# Patient Record
Sex: Female | Born: 1985 | Race: Black or African American | Hispanic: No | Marital: Single | Smoking: Never smoker
Health system: Southern US, Community
[De-identification: ages and names within clinical notes are randomized; demographics above are authoritative.]

## PROBLEM LIST (undated history)

## (undated) DIAGNOSIS — E669 Obesity, unspecified: Secondary | ICD-10-CM

---

## 2012-12-26 ENCOUNTER — Emergency Department: Payer: Self-pay | Admitting: Emergency Medicine

## 2012-12-26 LAB — COMPREHENSIVE METABOLIC PANEL
Albumin: 3.3 g/dL — ABNORMAL LOW (ref 3.4–5.0)
Anion Gap: 5 — ABNORMAL LOW (ref 7–16)
BUN: 9 mg/dL (ref 7–18)
Chloride: 105 mmol/L (ref 98–107)
Co2: 29 mmol/L (ref 21–32)
Creatinine: 0.65 mg/dL (ref 0.60–1.30)
EGFR (African American): >60
EGFR (Non-African Amer.): >60
Glucose: 80 mg/dL (ref 65–99)
Osmolality: 275 (ref 275–301)
Potassium: 4.2 mmol/L (ref 3.5–5.1)
SGPT (ALT): 16 U/L (ref 12–78)
Total Protein: 8 g/dL (ref 6.4–8.2)

## 2012-12-26 LAB — GC/CHLAMYDIA PROBE AMP

## 2012-12-26 LAB — CBC
HCT: 38.9 % (ref 35.0–47.0)
MCH: 27.8 pg (ref 26.0–34.0)
MCHC: 33.1 g/dL (ref 32.0–36.0)
MCV: 84 fL (ref 80–100)
Platelet: 271 10*3/uL (ref 150–440)
RBC: 4.63 10*6/uL (ref 3.80–5.20)
RDW: 15.4 % — ABNORMAL HIGH (ref 11.5–14.5)
WBC: 6.8 10*3/uL (ref 3.6–11.0)

## 2012-12-26 LAB — URINALYSIS, COMPLETE
Glucose,UR: NEGATIVE mg/dL (ref 0–75)
Ketone: NEGATIVE
Nitrite: NEGATIVE
RBC,UR: 1 /HPF (ref 0–5)
WBC UR: 21 /HPF (ref 0–5)

## 2012-12-26 LAB — WET PREP, GENITAL

## 2013-02-27 ENCOUNTER — Emergency Department: Payer: Self-pay | Admitting: Emergency Medicine

## 2013-02-27 LAB — URINALYSIS, COMPLETE
Bilirubin,UR: NEGATIVE
Nitrite: NEGATIVE
Ph: 7 (ref 4.5–8.0)
Protein: 30
Specific Gravity: 1.018 (ref 1.003–1.030)
Squamous Epithelial: 3
WBC UR: 395 /HPF (ref 0–5)

## 2013-02-27 LAB — COMPREHENSIVE METABOLIC PANEL
Alkaline Phosphatase: 76 U/L (ref 50–136)
Bilirubin,Total: 0.2 mg/dL (ref 0.2–1.0)
Co2: 28 mmol/L (ref 21–32)
Creatinine: 0.71 mg/dL (ref 0.60–1.30)
EGFR (African American): >60
Glucose: 92 mg/dL (ref 65–99)
Osmolality: 276 (ref 275–301)
Potassium: 3.7 mmol/L (ref 3.5–5.1)
SGOT(AST): 43 U/L — ABNORMAL HIGH (ref 15–37)
SGPT (ALT): 32 U/L (ref 12–78)
Sodium: 139 mmol/L (ref 136–145)
Total Protein: 7.1 g/dL (ref 6.4–8.2)

## 2013-02-27 LAB — CBC
HCT: 35.6 % (ref 35.0–47.0)
HGB: 11.7 g/dL — ABNORMAL LOW (ref 12.0–16.0)
MCH: 27.6 pg (ref 26.0–34.0)
MCV: 84 fL (ref 80–100)
Platelet: 230 10*3/uL (ref 150–440)
RDW: 16.1 % — ABNORMAL HIGH (ref 11.5–14.5)
WBC: 9.1 10*3/uL (ref 3.6–11.0)

## 2013-02-28 LAB — GC/CHLAMYDIA PROBE AMP

## 2013-03-01 LAB — URINE CULTURE

## 2013-03-29 ENCOUNTER — Ambulatory Visit: Payer: Self-pay | Admitting: Internal Medicine

## 2013-04-19 ENCOUNTER — Ambulatory Visit: Payer: Self-pay | Admitting: Medical

## 2013-04-19 LAB — URINALYSIS, COMPLETE
Bilirubin,UR: NEGATIVE
Glucose,UR: NEGATIVE mg/dL (ref 0–75)
Leukocyte Esterase: NEGATIVE
Nitrite: NEGATIVE
Ph: 6 (ref 4.5–8.0)
Protein: NEGATIVE
RBC,UR: NONE SEEN /HPF (ref 0–5)
Specific Gravity: >=1.03 (ref 1.003–1.030)

## 2013-04-19 LAB — PREGNANCY, URINE: Pregnancy Test, Urine: NEGATIVE m[IU]/mL

## 2013-04-21 LAB — URINE CULTURE

## 2013-05-23 ENCOUNTER — Emergency Department: Payer: Self-pay | Admitting: Emergency Medicine

## 2013-05-23 LAB — CBC WITH DIFFERENTIAL/PLATELET
BASOS ABS: 0.1 10*3/uL (ref 0.0–0.1)
Basophil %: 0.6 %
Eosinophil #: 0.5 10*3/uL (ref 0.0–0.7)
Eosinophil %: 6.3 %
HCT: 38.6 % (ref 35.0–47.0)
HGB: 12.9 g/dL (ref 12.0–16.0)
LYMPHS ABS: 3.3 10*3/uL (ref 1.0–3.6)
Lymphocyte %: 42 %
MCH: 28.8 pg (ref 26.0–34.0)
MCHC: 33.4 g/dL (ref 32.0–36.0)
MCV: 86 fL (ref 80–100)
Monocyte #: 0.5 x10 3/mm (ref 0.2–0.9)
Monocyte %: 6.7 %
Neutrophil #: 3.5 10*3/uL (ref 1.4–6.5)
Neutrophil %: 44.4 %
Platelet: 261 10*3/uL (ref 150–440)
RBC: 4.48 10*6/uL (ref 3.80–5.20)
RDW: 16.1 % — AB (ref 11.5–14.5)
WBC: 7.9 10*3/uL (ref 3.6–11.0)

## 2013-05-23 LAB — URINALYSIS, COMPLETE
Bacteria: NONE SEEN
Bilirubin,UR: NEGATIVE
Blood: NEGATIVE
GLUCOSE, UR: NEGATIVE mg/dL (ref 0–75)
KETONE: NEGATIVE
LEUKOCYTE ESTERASE: NEGATIVE
Nitrite: NEGATIVE
PROTEIN: NEGATIVE
Ph: 5 (ref 4.5–8.0)
RBC,UR: 1 /HPF (ref 0–5)
SPECIFIC GRAVITY: 1.03 (ref 1.003–1.030)
Squamous Epithelial: 2
WBC UR: 1 /HPF (ref 0–5)

## 2013-05-23 LAB — COMPREHENSIVE METABOLIC PANEL
ALK PHOS: 91 U/L
ANION GAP: 1 — AB (ref 7–16)
AST: 20 U/L (ref 15–37)
Albumin: 3.3 g/dL — ABNORMAL LOW (ref 3.4–5.0)
BUN: 11 mg/dL (ref 7–18)
Bilirubin,Total: 0.3 mg/dL (ref 0.2–1.0)
CO2: 30 mmol/L (ref 21–32)
CREATININE: 0.73 mg/dL (ref 0.60–1.30)
Calcium, Total: 8.7 mg/dL (ref 8.5–10.1)
Chloride: 105 mmol/L (ref 98–107)
EGFR (African American): >60
EGFR (Non-African Amer.): >60
GLUCOSE: 103 mg/dL — AB (ref 65–99)
Osmolality: 272 (ref 275–301)
Potassium: 3.5 mmol/L (ref 3.5–5.1)
SGPT (ALT): 18 U/L (ref 12–78)
Sodium: 136 mmol/L (ref 136–145)
TOTAL PROTEIN: 7.9 g/dL (ref 6.4–8.2)

## 2013-05-23 LAB — GC/CHLAMYDIA PROBE AMP

## 2013-05-23 LAB — WET PREP, GENITAL

## 2013-12-19 ENCOUNTER — Emergency Department: Payer: Self-pay | Admitting: Emergency Medicine

## 2013-12-23 ENCOUNTER — Emergency Department: Payer: Self-pay | Admitting: Emergency Medicine

## 2014-03-05 ENCOUNTER — Emergency Department: Payer: Self-pay | Admitting: Emergency Medicine

## 2014-03-05 LAB — BASIC METABOLIC PANEL
Anion Gap: 5 — ABNORMAL LOW (ref 7–16)
BUN: 11 mg/dL (ref 7–18)
CALCIUM: 8.6 mg/dL (ref 8.5–10.1)
CHLORIDE: 106 mmol/L (ref 98–107)
Co2: 30 mmol/L (ref 21–32)
Creatinine: 0.69 mg/dL (ref 0.60–1.30)
EGFR (Non-African Amer.): >60
Glucose: 85 mg/dL (ref 65–99)
Osmolality: 280 (ref 275–301)
Potassium: 4.1 mmol/L (ref 3.5–5.1)
Sodium: 141 mmol/L (ref 136–145)

## 2014-03-05 LAB — URINALYSIS, COMPLETE
Bacteria: NONE SEEN
Bilirubin,UR: NEGATIVE
Blood: NEGATIVE
GLUCOSE, UR: NEGATIVE mg/dL (ref 0–75)
Ketone: NEGATIVE
Leukocyte Esterase: NEGATIVE
Nitrite: NEGATIVE
Ph: 7 (ref 4.5–8.0)
Protein: NEGATIVE
Specific Gravity: 1.009 (ref 1.003–1.030)
WBC UR: 1 /HPF (ref 0–5)

## 2014-03-05 LAB — CBC
HCT: 37.5 % (ref 35.0–47.0)
HGB: 11.6 g/dL — AB (ref 12.0–16.0)
MCH: 27.6 pg (ref 26.0–34.0)
MCHC: 30.9 g/dL — AB (ref 32.0–36.0)
MCV: 89 fL (ref 80–100)
PLATELETS: 278 10*3/uL (ref 150–440)
RBC: 4.2 10*6/uL (ref 3.80–5.20)
RDW: 15.6 % — AB (ref 11.5–14.5)
WBC: 5.5 10*3/uL (ref 3.6–11.0)

## 2014-03-05 LAB — PREGNANCY, URINE: Pregnancy Test, Urine: NEGATIVE m[IU]/mL

## 2014-06-11 ENCOUNTER — Emergency Department: Payer: Self-pay | Admitting: Emergency Medicine

## 2014-06-11 LAB — COMPREHENSIVE METABOLIC PANEL
Albumin: 3 g/dL — ABNORMAL LOW (ref 3.4–5.0)
Alkaline Phosphatase: 79 U/L (ref 46–116)
Anion Gap: 8 (ref 7–16)
BUN: 14 mg/dL (ref 7–18)
Bilirubin,Total: 0.2 mg/dL (ref 0.2–1.0)
CHLORIDE: 108 mmol/L — AB (ref 98–107)
CO2: 25 mmol/L (ref 21–32)
Calcium, Total: 8.9 mg/dL (ref 8.5–10.1)
Creatinine: 0.79 mg/dL (ref 0.60–1.30)
EGFR (African American): >60
Glucose: 113 mg/dL — ABNORMAL HIGH (ref 65–99)
Osmolality: 283 (ref 275–301)
POTASSIUM: 4.1 mmol/L (ref 3.5–5.1)
SGOT(AST): 22 U/L (ref 15–37)
SGPT (ALT): 16 U/L (ref 14–63)
SODIUM: 141 mmol/L (ref 136–145)
Total Protein: 7.5 g/dL (ref 6.4–8.2)

## 2014-06-11 LAB — CBC WITH DIFFERENTIAL/PLATELET
BASOS PCT: 0.8 %
Basophil #: 0 10*3/uL (ref 0.0–0.1)
EOS ABS: 0.2 10*3/uL (ref 0.0–0.7)
Eosinophil %: 3.5 %
HCT: 35.8 % (ref 35.0–47.0)
HGB: 11.4 g/dL — AB (ref 12.0–16.0)
LYMPHS ABS: 2.7 10*3/uL (ref 1.0–3.6)
Lymphocyte %: 42.7 %
MCH: 28 pg (ref 26.0–34.0)
MCHC: 32 g/dL (ref 32.0–36.0)
MCV: 87 fL (ref 80–100)
Monocyte #: 0.5 x10 3/mm (ref 0.2–0.9)
Monocyte %: 7.2 %
NEUTROS ABS: 2.9 10*3/uL (ref 1.4–6.5)
Neutrophil %: 45.8 %
Platelet: 272 10*3/uL (ref 150–440)
RBC: 4.09 10*6/uL (ref 3.80–5.20)
RDW: 14.9 % — ABNORMAL HIGH (ref 11.5–14.5)
WBC: 6.3 10*3/uL (ref 3.6–11.0)

## 2014-06-11 LAB — URINALYSIS, COMPLETE
Bacteria: NONE SEEN
Bilirubin,UR: NEGATIVE
Blood: NEGATIVE
GLUCOSE, UR: NEGATIVE mg/dL (ref 0–75)
Ketone: NEGATIVE
Leukocyte Esterase: NEGATIVE
NITRITE: NEGATIVE
PH: 5 (ref 4.5–8.0)
Protein: NEGATIVE
RBC,UR: <1 /HPF (ref 0–5)
Specific Gravity: 1.02 (ref 1.003–1.030)
WBC UR: 1 /HPF (ref 0–5)

## 2014-10-27 ENCOUNTER — Encounter: Payer: Self-pay | Admitting: Emergency Medicine

## 2014-10-27 DIAGNOSIS — H66002 Acute suppurative otitis media without spontaneous rupture of ear drum, left ear: Secondary | ICD-10-CM | POA: Insufficient documentation

## 2014-10-27 DIAGNOSIS — J069 Acute upper respiratory infection, unspecified: Secondary | ICD-10-CM | POA: Insufficient documentation

## 2014-10-27 DIAGNOSIS — M940 Chondrocostal junction syndrome [Tietze]: Secondary | ICD-10-CM | POA: Insufficient documentation

## 2014-10-27 DIAGNOSIS — N3000 Acute cystitis without hematuria: Secondary | ICD-10-CM | POA: Insufficient documentation

## 2014-10-27 DIAGNOSIS — Z3202 Encounter for pregnancy test, result negative: Secondary | ICD-10-CM | POA: Diagnosis not present

## 2014-10-27 DIAGNOSIS — Z792 Long term (current) use of antibiotics: Secondary | ICD-10-CM | POA: Insufficient documentation

## 2014-10-27 DIAGNOSIS — Z880 Allergy status to penicillin: Secondary | ICD-10-CM | POA: Insufficient documentation

## 2014-10-27 DIAGNOSIS — M791 Myalgia: Secondary | ICD-10-CM | POA: Diagnosis present

## 2014-10-27 NOTE — ED Notes (Signed)
Pt arrived to the ED for complains of generalized body aches,ear pain and cough x1 day and thinks that she has a UTI. Pt states that her "daughter is sick with similar symptoms and was diagnosed with an ear infection." Pt is AOx4 in no apparent distress.

## 2014-10-28 ENCOUNTER — Emergency Department
Admission: EM | Admit: 2014-10-28 | Discharge: 2014-10-28 | Disposition: A | Discharge status: Home / Self Care | Payer: Medicaid Other | Attending: Emergency Medicine | Admitting: Emergency Medicine

## 2014-10-28 ENCOUNTER — Emergency Department: Payer: Medicaid Other

## 2014-10-28 DIAGNOSIS — M940 Chondrocostal junction syndrome [Tietze]: Secondary | ICD-10-CM

## 2014-10-28 DIAGNOSIS — J069 Acute upper respiratory infection, unspecified: Secondary | ICD-10-CM

## 2014-10-28 DIAGNOSIS — H66002 Acute suppurative otitis media without spontaneous rupture of ear drum, left ear: Secondary | ICD-10-CM

## 2014-10-28 DIAGNOSIS — N3000 Acute cystitis without hematuria: Secondary | ICD-10-CM

## 2014-10-28 LAB — URINALYSIS COMPLETE WITH MICROSCOPIC (ARMC ONLY)
Bilirubin Urine: NEGATIVE
GLUCOSE, UA: NEGATIVE mg/dL
Hgb urine dipstick: NEGATIVE
NITRITE: POSITIVE — AB
Protein, ur: NEGATIVE mg/dL
Specific Gravity, Urine: 1.025 (ref 1.005–1.030)
pH: 5 (ref 5.0–8.0)

## 2014-10-28 LAB — POCT PREGNANCY, URINE: Preg Test, Ur: NEGATIVE

## 2014-10-28 MED ORDER — ALBUTEROL SULFATE HFA 108 (90 BASE) MCG/ACT IN AERS: 2.0000 | INHALATION_SPRAY | Freq: Four times a day (QID) | RESPIRATORY_TRACT | Status: DC | PRN

## 2014-10-28 MED ORDER — ALBUTEROL SULFATE (2.5 MG/3ML) 0.083% IN NEBU
2.5000 mg | INHALATION_SOLUTION | Freq: Once | RESPIRATORY_TRACT | Status: AC
  Administered 2014-10-28: 2.5 mg via RESPIRATORY_TRACT

## 2014-10-28 MED ORDER — SULFAMETHOXAZOLE-TRIMETHOPRIM 800-160 MG PO TABS
ORAL_TABLET | ORAL | Status: AC
  Administered 2014-10-28: 1 via ORAL
  Filled 2014-10-28: qty 1

## 2014-10-28 MED ORDER — SULFAMETHOXAZOLE-TRIMETHOPRIM 800-160 MG PO TABS
1.0000 | ORAL_TABLET | Freq: Once | ORAL | Status: AC
  Administered 2014-10-28: 1 via ORAL

## 2014-10-28 MED ORDER — BENZONATATE 100 MG PO CAPS: 100.0000 mg | ORAL_CAPSULE | Freq: Four times a day (QID) | ORAL | Status: DC | PRN

## 2014-10-28 MED ORDER — KETOROLAC TROMETHAMINE 30 MG/ML IJ SOLN
INTRAMUSCULAR | Status: AC
  Filled 2014-10-28: qty 1

## 2014-10-28 MED ORDER — SULFAMETHOXAZOLE-TRIMETHOPRIM 800-160 MG PO TABS: 1.0000 | ORAL_TABLET | Freq: Two times a day (BID) | ORAL | Status: AC

## 2014-10-28 MED ORDER — AZITHROMYCIN 250 MG PO TABS: ORAL_TABLET | ORAL | Status: AC

## 2014-10-28 MED ORDER — ALBUTEROL SULFATE (2.5 MG/3ML) 0.083% IN NEBU
INHALATION_SOLUTION | RESPIRATORY_TRACT | Status: AC
  Administered 2014-10-28: 2.5 mg via RESPIRATORY_TRACT
  Filled 2014-10-28: qty 3

## 2014-10-28 MED ORDER — KETOROLAC TROMETHAMINE 60 MG/2ML IM SOLN
60.0000 mg | Freq: Once | INTRAMUSCULAR | Status: AC
  Administered 2014-10-28: 60 mg via INTRAMUSCULAR

## 2014-10-28 MED ORDER — KETOROLAC TROMETHAMINE 60 MG/2ML IM SOLN
INTRAMUSCULAR | Status: AC
  Administered 2014-10-28: 60 mg via INTRAMUSCULAR
  Filled 2014-10-28: qty 2

## 2014-10-28 NOTE — Discharge Instructions (Signed)
Costochondritis Costochondritis, sometimes called Tietze syndrome, is a swelling and irritation (inflammation) of the tissue (cartilage) that connects your ribs with your breastbone (sternum). It causes pain in the chest and rib area. Costochondritis usually goes away on its own over time. It can take up to 6 weeks or longer to get better, especially if you are unable to limit your activities. CAUSES  Some cases of costochondritis have no known cause. Possible causes include:  Injury (trauma).  Exercise or activity such as lifting.  Severe coughing. SIGNS AND SYMPTOMS  Pain and tenderness in the chest and rib area.  Pain that gets worse when coughing or taking deep breaths.  Pain that gets worse with specific movements. DIAGNOSIS  Your health care provider will do a physical exam and ask about your symptoms. Chest X-rays or other tests may be done to rule out other problems. TREATMENT  Costochondritis usually goes away on its own over time. Your health care provider may prescribe medicine to help relieve pain. HOME CARE INSTRUCTIONS   Avoid exhausting physical activity. Try not to strain your ribs during normal activity. This would include any activities using chest, abdominal, and side muscles, especially if heavy weights are used.  Apply ice to the affected area for the first 2 days after the pain begins.  Put ice in a plastic bag.  Place a towel between your skin and the bag.  Leave the ice on for 20 minutes, 2-3 times a day.  Only take over-the-counter or prescription medicines as directed by your health care provider. SEEK MEDICAL CARE IF:  You have redness or swelling at the rib joints. These are signs of infection.  Your pain does not go away despite rest or medicine. SEEK IMMEDIATE MEDICAL CARE IF:   Your pain increases or you are very uncomfortable.  You have shortness of breath or difficulty breathing.  You cough up blood.  You have worse chest pains,  sweating, or vomiting.  You have a fever or persistent symptoms for more than 2-3 days.  You have a fever and your symptoms suddenly get worse. MAKE SURE YOU:   Understand these instructions.  Will watch your condition.  Will get help right away if you are not doing well or get worse. Document Released: 02/09/2005 Document Revised: 02/20/2013 Document Reviewed: 12/04/2012 [facility] Patient Information 2015 [place], [place]. This information is not intended to replace advice given to you by your health care provider. Make sure you discuss any questions you have with your health care provider.  Otitis Media Otitis media is redness, soreness, and inflammation of the middle ear. Otitis media may be caused by allergies or, most commonly, by infection. Often it occurs as a complication of the common cold. SIGNS AND SYMPTOMS Symptoms of otitis media may include:  Earache.  Fever.  Ringing in your ear.  Headache.  Leakage of fluid from the ear. DIAGNOSIS To diagnose otitis media, your health care provider will examine your ear with an otoscope. This is an instrument that allows your health care provider to see into your ear in order to examine your eardrum. Your health care provider also will ask you questions about your symptoms. TREATMENT  Typically, otitis media resolves on its own within 3-5 days. Your health care provider may prescribe medicine to ease your symptoms of pain. If otitis media does not resolve within 5 days or is recurrent, your health care provider may prescribe antibiotic medicines if he or she suspects that a bacterial infection is the cause.  HOME CARE INSTRUCTIONS   If you were prescribed an antibiotic medicine, finish it all even if you start to feel better.  Take medicines only as directed by your health care provider.  Keep all follow-up visits as directed by your health care provider. SEEK MEDICAL CARE IF:  You have otitis media only in one ear, or bleeding  from your nose, or both.  You notice a lump on your neck.  You are not getting better in 3-5 days.  You feel worse instead of better. SEEK IMMEDIATE MEDICAL CARE IF:   You have pain that is not controlled with medicine.  You have swelling, redness, or pain around your ear or stiffness in your neck.  You notice that part of your face is paralyzed.  You notice that the bone behind your ear (mastoid) is tender when you touch it. MAKE SURE YOU:   Understand these instructions.  Will watch your condition.  Will get help right away if you are not doing well or get worse. Document Released: 02/05/2004 Document Revised: 09/16/2013 Document Reviewed: 11/27/2012 [facility] Patient Information 2015 [place], [place]. This information is not intended to replace advice given to you by your health care provider. Make sure you discuss any questions you have with your health care provider.  Upper Respiratory Infection, Adult An upper respiratory infection (URI) is also sometimes known as the common cold. The upper respiratory tract includes the nose, sinuses, throat, trachea, and bronchi. Bronchi are the airways leading to the lungs. Most people improve within 1 week, but symptoms can last up to 2 weeks. A residual cough may last even longer.  CAUSES Many different viruses can infect the tissues lining the upper respiratory tract. The tissues become irritated and inflamed and often become very moist. Mucus production is also common. A cold is contagious. You can easily spread the virus to others by oral contact. This includes kissing, sharing a glass, coughing, or sneezing. Touching your mouth or nose and then touching a surface, which is then touched by another person, can also spread the virus. SYMPTOMS  Symptoms typically develop 1 to 3 days after you come in contact with a cold virus. Symptoms vary from person to person. They may include:  Runny nose.  Sneezing.  Nasal congestion.  Sinus  irritation.  Sore throat.  Loss of voice (laryngitis).  Cough.  Fatigue.  Muscle aches.  Loss of appetite.  Headache.  Low-grade fever. DIAGNOSIS  You might diagnose your own cold based on familiar symptoms, since most people get a cold 2 to 3 times a year. Your caregiver can confirm this based on your exam. Most importantly, your caregiver can check that your symptoms are not due to another disease such as strep throat, sinusitis, pneumonia, asthma, or epiglottitis. Blood tests, throat tests, and X-rays are not necessary to diagnose a common cold, but they may sometimes be helpful in excluding other more serious diseases. Your caregiver will decide if any further tests are required. RISKS AND COMPLICATIONS  You may be at risk for a more severe case of the common cold if you smoke cigarettes, have chronic heart disease (such as heart failure) or lung disease (such as asthma), or if you have a weakened immune system. The very young and very old are also at risk for more serious infections. Bacterial sinusitis, middle ear infections, and bacterial pneumonia can complicate the common cold. The common cold can worsen asthma and chronic obstructive pulmonary disease (COPD). Sometimes, these complications can require emergency medical  care and may be life-threatening. PREVENTION  The best way to protect against getting a cold is to practice good hygiene. Avoid oral or hand contact with people with cold symptoms. Wash your hands often if contact occurs. There is no clear evidence that vitamin C, vitamin E, echinacea, or exercise reduces the chance of developing a cold. However, it is always recommended to get plenty of rest and practice good nutrition. TREATMENT  Treatment is directed at relieving symptoms. There is no cure. Antibiotics are not effective, because the infection is caused by a virus, not by bacteria. Treatment may include:  Increased fluid intake. Sports drinks offer valuable  electrolytes, sugars, and fluids.  Breathing heated mist or steam (vaporizer or shower).  Eating chicken soup or other clear broths, and maintaining good nutrition.  Getting plenty of rest.  Using gargles or lozenges for comfort.  Controlling fevers with ibuprofen or acetaminophen as directed by your caregiver.  Increasing usage of your inhaler if you have asthma. Zinc gel and zinc lozenges, taken in the first 24 hours of the common cold, can shorten the duration and lessen the severity of symptoms. Pain medicines may help with fever, muscle aches, and throat pain. A variety of non-prescription medicines are available to treat congestion and runny nose. Your caregiver can make recommendations and may suggest nasal or lung inhalers for other symptoms.  HOME CARE INSTRUCTIONS   Only take over-the-counter or prescription medicines for pain, discomfort, or fever as directed by your caregiver.  Use a warm mist humidifier or inhale steam from a shower to increase air moisture. This may keep secretions moist and make it easier to breathe.  Drink enough water and fluids to keep your urine clear or pale yellow.  Rest as needed.  Return to work when your temperature has returned to normal or as your caregiver advises. You may need to stay home longer to avoid infecting others. You can also use a face mask and careful hand washing to prevent spread of the virus. SEEK MEDICAL CARE IF:   After the first few days, you feel you are getting worse rather than better.  You need your caregiver's advice about medicines to control symptoms.  You develop chills, worsening shortness of breath, or brown or red sputum. These may be signs of pneumonia.  You develop yellow or brown nasal discharge or pain in the face, especially when you bend forward. These may be signs of sinusitis.  You develop a fever, swollen neck glands, pain with swallowing, or white areas in the back of your throat. These may be signs  of strep throat. SEEK IMMEDIATE MEDICAL CARE IF:   You have a fever.  You develop severe or persistent headache, ear pain, sinus pain, or chest pain.  You develop wheezing, a prolonged cough, cough up blood, or have a change in your usual mucus (if you have chronic lung disease).  You develop sore muscles or a stiff neck. Document Released: 10/26/2000 Document Revised: 07/25/2011 Document Reviewed: 08/07/2013 [facility] Patient Information 2015 [place], [place]. This information is not intended to replace advice given to you by your health care provider. Make sure you discuss any questions you have with your health care provider.

## 2014-10-28 NOTE — ED Notes (Signed)
Sherilyn Cooter, RN Called lab to add urine culture to collected urine sample.

## 2014-10-28 NOTE — ED Provider Notes (Signed)
[facility] Emergency Department Provider Note  ____________________________________________  Time seen: Approximately 12:23 AM  I have reviewed the triage vital signs and the nursing notes.   HISTORY  Chief Complaint Generalized Body Aches    HPI REANN CURTAIN is a 28 y.o. female comes in with multiple complaints. The patient reports that her ears hurt, she has a headache, she has chest and back pain as well as pain with urination. The patient reports that her symptoms all started yesterday afternoon. She started with a runny nose and a headache. She reports that she has been taking Tylenol severe cold symptoms but has not been helping. The patient has not checked her temperature so she is unsure she's had a fever. She has had a productive cough of yellow phlegm. The patient reports that her daughter has been sick with an ear infection so she is unsure if she may have gotten symptoms from her daughter. The patient has a primary care physician but reports that she has not gone to see him. She has had some mild shortness of breath and chest tightness. She's had a decreased appetite. She reports that her entire back also hurts. She denies any nausea or vomiting but has had some mild sore throat. The patient is here for evaluation and treatment.   History reviewed. No pertinent past medical history.  There are no active problems to display for this patient.   C-section  Current Outpatient Rx  Name  Route  Sig  Dispense  Refill  . albuterol (PROVENTIL HFA;VENTOLIN HFA) 108 (90 BASE) MCG/ACT inhaler   Inhalation   Inhale 2 puffs into the lungs every 6 (six) hours as needed for wheezing or shortness of breath.   1 Inhaler   0   . azithromycin (ZITHROMAX Z-PAK) 250 MG tablet      Take 2 tablets (500 mg) on  Day 1,  followed by 1 tablet (250 mg) once daily on Days 2 through 5.   6 each   0   . benzonatate (TESSALON PERLES) 100 MG capsule   Oral   Take 1  capsule (100 mg total) by mouth every 6 (six) hours as needed for cough.   15 capsule   0   . sulfamethoxazole-trimethoprim (BACTRIM DS,SEPTRA DS) 800-160 MG per tablet   Oral   Take 1 tablet by mouth 2 (two) times daily.   10 tablet   0     Allergies Amoxicillin; Morphine and related; and Penicillins  History reviewed. No pertinent family history.  Social History History  Substance Use Topics  . Smoking status: Never Smoker   . Smokeless tobacco: Not on file  . Alcohol Use: Yes    Review of Systems Constitutional: No fever/chills Eyes: No visual changes. ENT: sore throat. Cardiovascular: chest pain. Respiratory: shortness of breath. Gastrointestinal: No abdominal pain.  No nausea, no vomiting.   Genitourinary:  dysuria. Musculoskeletal: back pain. Skin: Negative for rash. Neurological: Headache  10-point ROS otherwise negative.  ____________________________________________   PHYSICAL EXAM:  VITAL SIGNS: ED Triage Vitals  Enc Vitals Group     BP 10/27/14 2226 119/72 mmHg     Pulse Rate 10/27/14 2226 99     Resp 10/27/14 2226 18     Temp 10/27/14 2226 98.8 F (37.1 C)     Temp Source 10/27/14 2226 Oral     SpO2 10/27/14 2226 97 %     Weight 10/27/14 2226 240 lb (108.863 kg)     Height 10/27/14  2226 5' (1.524 m)     Head Cir --      Peak Flow --      Pain Score 10/27/14 2227 10     Pain Loc --      Pain Edu? --      Excl. in GC? --     Constitutional: Alert and oriented. Well appearing and in moderate distress. Eyes: Conjunctivae are normal. PERRL. EOMI. Head: Atraumatic, left TM with some erythema and mild bulging Nose: No congestion/rhinnorhea. Mouth/Throat: Mucous membranes are moist.  Oropharynx non-erythematous. Hematological/Lymphatic/Immunilogical: No cervical lymphadenopathy. Cardiovascular: Normal rate, regular rhythm. Grossly normal heart sounds.  Good peripheral circulation. Respiratory: Normal respiratory effort.  No retractions. Lungs  CTAB, chest tender to palpation Gastrointestinal: Soft and nontender. No distention. Positive bowel sounds Genitourinary: Deferred Musculoskeletal: No lower extremity tenderness nor edema.   Neurologic:  Normal speech and language. No gross focal neurologic deficits are appreciated.  Skin:  Skin is warm, dry and intact. No rash noted. Psychiatric: Mood and affect are normal.   ____________________________________________   LABS (all labs ordered are listed, but only abnormal results are displayed)  Labs Reviewed  URINALYSIS COMPLETEWITH MICROSCOPIC ([facility] ONLY) - Abnormal; Notable for the following:    Color, Urine YELLOW (*)    APPearance HAZY (*)    Ketones, ur TRACE (*)    Nitrite POSITIVE (*)    Leukocytes, UA 1+ (*)    Bacteria, UA MANY (*)    Squamous Epithelial / LPF 6-30 (*)    All other components within normal limits  URINE CULTURE  POCT PREGNANCY, URINE   ____________________________________________  EKG  None ____________________________________________  RADIOLOGY  Chest x-ray: No acute cardiopulmonary disease ____________________________________________   PROCEDURES  Procedure(s) performed: None  Critical Care performed: No  ____________________________________________   INITIAL IMPRESSION / ASSESSMENT AND PLAN / ED COURSE  Pertinent labs & imaging results that were available during my care of the patient were reviewed by me and considered in my medical decision making (see chart for details).  This is a 29 year old female who comes in with multiple complaints including ear pain and headache chest him back pain and pain with urination. Given the patient's symptoms it is likely that she has a urinary tract infection. She does appear to have an otitis media on the left side. I will do a chest x-ray to evaluate the patient for possible pneumonia and check the patient's urine for infection. I will give the patient a dose of Toradol and a breathing  treatment to see if that does help with some of the chest tightness.  ----------------------------------------- 3:02 AM on 10/28/2014 -----------------------------------------  The patient reports that she did have some mild improvement in her pain and the tightness in her chest after the breathing treatment. The patient also received a dose of Septra for her urinary tract infection. I will discharge the patient home to follow-up with her primary care physician in one to 2 days. ____________________________________________   FINAL CLINICAL IMPRESSION(S) / ED DIAGNOSES  Final diagnoses:  Upper respiratory infection  Acute suppurative otitis media of left ear without spontaneous rupture of tympanic membrane, recurrence not specified  Acute cystitis without hematuria  Costochondritis, acute      Rebecka Apley, MD 10/28/14 0302

## 2014-10-30 LAB — URINE CULTURE
Culture: >=100000
SPECIAL REQUESTS: NORMAL

## 2015-03-11 ENCOUNTER — Inpatient Hospital Stay (HOSPITAL_COMMUNITY)
Admission: AD | Admit: 2015-03-11 | Discharge: 2015-03-11 | Disposition: A | Discharge status: Home / Self Care | Payer: Self-pay | Source: Ambulatory Visit | Attending: Family Medicine | Admitting: Family Medicine

## 2015-03-11 ENCOUNTER — Encounter (HOSPITAL_COMMUNITY): Payer: Self-pay | Admitting: *Deleted

## 2015-03-11 DIAGNOSIS — R102 Pelvic and perineal pain: Secondary | ICD-10-CM | POA: Insufficient documentation

## 2015-03-11 LAB — WET PREP, GENITAL
Trich, Wet Prep: NONE SEEN
YEAST WET PREP: NONE SEEN

## 2015-03-11 LAB — CBC
HCT: 35.6 % — ABNORMAL LOW (ref 36.0–46.0)
Hemoglobin: 11.5 g/dL — ABNORMAL LOW (ref 12.0–15.0)
MCH: 28.7 pg (ref 26.0–34.0)
MCHC: 32.3 g/dL (ref 30.0–36.0)
MCV: 88.8 fL (ref 78.0–100.0)
Platelets: 254 10*3/uL (ref 150–400)
RBC: 4.01 MIL/uL (ref 3.87–5.11)
RDW: 15.5 % (ref 11.5–15.5)
WBC: 8.1 10*3/uL (ref 4.0–10.5)

## 2015-03-11 LAB — URINALYSIS, ROUTINE W REFLEX MICROSCOPIC
BILIRUBIN URINE: NEGATIVE
Glucose, UA: NEGATIVE mg/dL
Hgb urine dipstick: NEGATIVE
KETONES UR: NEGATIVE mg/dL
Leukocytes, UA: NEGATIVE
Nitrite: NEGATIVE
Protein, ur: NEGATIVE mg/dL
UROBILINOGEN UA: 0.2 mg/dL (ref 0.0–1.0)
pH: 5.5 (ref 5.0–8.0)

## 2015-03-11 LAB — POCT PREGNANCY, URINE: PREG TEST UR: NEGATIVE

## 2015-03-11 MED ORDER — KETOROLAC TROMETHAMINE 60 MG/2ML IM SOLN
60.0000 mg | Freq: Once | INTRAMUSCULAR | Status: AC
  Administered 2015-03-11: 60 mg via INTRAMUSCULAR
  Filled 2015-03-11: qty 2

## 2015-03-11 MED ORDER — IBUPROFEN 800 MG PO TABS: 800.0000 mg | ORAL_TABLET | Freq: Three times a day (TID) | ORAL | Status: DC | PRN

## 2015-03-11 NOTE — MAU Provider Note (Signed)
History     CSN: [id number]  Arrival date and time: 03/11/15 1191   First Provider Initiated Contact with Patient 03/11/15 2101      Chief Complaint  Patient presents with  . Abdominal Pain  . Pelvic Pain   Pelvic Pain The patient's primary symptoms include pelvic pain. This is a new problem. The current episode started yesterday. The problem occurs constantly. The problem has been gradually worsening. Pain severity now: 8/10. The problem affects both sides. She is not pregnant. Associated symptoms include abdominal pain, dysuria and nausea. Pertinent negatives include no constipation, diarrhea, fever, frequency, urgency (x1 today ) or vomiting. The vaginal discharge was normal. There has been no bleeding (LMP 9/?/2016). The symptoms are aggravated by intercourse. She has tried nothing for the symptoms. She is sexually active. No, her partner does not have an STD. She uses nothing for contraception. Her menstrual history has been regular.    History reviewed. No pertinent past medical history.  History reviewed. No pertinent past surgical history.  History reviewed. No pertinent family history.  Social History  Substance Use Topics  . Smoking status: Never Smoker   . Smokeless tobacco: None  . Alcohol Use: Yes    Allergies:  Allergies  Allergen Reactions  . Amoxicillin Hives  . Morphine And Related Nausea And Vomiting  . Penicillins Hives    Has patient had a PCN reaction causing immediate rash, facial/tongue/throat swelling, SOB or lightheadedness with hypotension: Yes Has patient had a PCN reaction causing severe rash involving mucus membranes or skin necrosis: No Has patient had a PCN reaction that required hospitalization No Has patient had a PCN reaction occurring within the last 10 years: Yes If all of the above answers are "NO", then may proceed with Cephalosporin use.   Marland Kitchen Pineapple Itching    Irritates the mouth   . Tomato Itching    Irritates the mouth     Prescriptions prior to admission  Medication Sig Dispense Refill Last Dose  . Prenatal Vit-Fe Fumarate-FA (PRENATAL MULTIVITAMIN) TABS tablet Take 1 tablet by mouth daily at 12 noon.   Past Week at Unknown time  . albuterol (PROVENTIL HFA;VENTOLIN HFA) 108 (90 BASE) MCG/ACT inhaler Inhale 2 puffs into the lungs every 6 (six) hours as needed for wheezing or shortness of breath. 1 Inhaler 0   . benzonatate (TESSALON PERLES) 100 MG capsule Take 1 capsule (100 mg total) by mouth every 6 (six) hours as needed for cough. 15 capsule 0     Review of Systems  Constitutional: Negative for fever.  Gastrointestinal: Positive for nausea and abdominal pain. Negative for vomiting, diarrhea and constipation.  Genitourinary: Positive for dysuria and pelvic pain. Negative for urgency (x1 today ) and frequency.   Physical Exam   Blood pressure 143/83, pulse 81, temperature 98.4 F (36.9 C), temperature source Oral, resp. rate 18, height 5' 0.5" (1.537 m), weight 123.469 kg (272 lb 3.2 oz), SpO2 98 %.  Physical Exam  Nursing note and vitals reviewed. Constitutional: She is oriented to person, place, and time. She appears well-developed and well-nourished. No distress.  HENT:  Head: Normocephalic.  Cardiovascular: Normal rate.   Respiratory: Effort normal.  GI: There is no tenderness.  Genitourinary:   External: no lesion Vagina: small amount of white discharge Cervix: pink, smooth, no CMT Uterus: NSSC Adnexa: NT   Neurological: She is alert and oriented to person, place, and time.  Skin: Skin is warm and dry.  Psychiatric: She has a normal mood and  affect.   Results for orders placed or performed during the hospital encounter of 03/11/15 (from the past 24 hour(s))  Urinalysis, Routine w reflex microscopic (not at [facility])     Status: Abnormal   Collection Time: 03/11/15  7:45 PM  Result Value Ref Range   Color, Urine YELLOW YELLOW   APPearance CLEAR CLEAR   Specific Gravity, Urine >1.030  (H) 1.005 - 1.030   pH 5.5 5.0 - 8.0   Glucose, UA NEGATIVE NEGATIVE mg/dL   Hgb urine dipstick NEGATIVE NEGATIVE   Bilirubin Urine NEGATIVE NEGATIVE   Ketones, ur NEGATIVE NEGATIVE mg/dL   Protein, ur NEGATIVE NEGATIVE mg/dL   Urobilinogen, UA 0.2 0.0 - 1.0 mg/dL   Nitrite NEGATIVE NEGATIVE   Leukocytes, UA NEGATIVE NEGATIVE  Pregnancy, urine POC     Status: None   Collection Time: 03/11/15  7:58 PM  Result Value Ref Range   Preg Test, Ur NEGATIVE NEGATIVE  Wet prep, genital     Status: Abnormal   Collection Time: 03/11/15  9:12 PM  Result Value Ref Range   Yeast Wet Prep HPF POC NONE SEEN NONE SEEN   Trich, Wet Prep NONE SEEN NONE SEEN   Clue Cells Wet Prep HPF POC FEW (A) NONE SEEN   WBC, Wet Prep HPF POC FEW (A) NONE SEEN  CBC     Status: Abnormal   Collection Time: 03/11/15  9:20 PM  Result Value Ref Range   WBC 8.1 4.0 - 10.5 K/uL   RBC 4.01 3.87 - 5.11 MIL/uL   Hemoglobin 11.5 (L) 12.0 - 15.0 g/dL   HCT 96.035.6 (L) 45.436.0 - 09.846.0 %   MCV 88.8 78.0 - 100.0 fL   MCH 28.7 26.0 - 34.0 pg   MCHC 32.3 30.0 - 36.0 g/dL   RDW 11.915.5 14.711.5 - 82.915.5 %   Platelets 254 150 - 400 K/uL     MAU Course  Procedures  MDM   Assessment and Plan   1. Pelvic pain in female    DC home Comfort measures reviewed  RX: ibuprofen 800mg  #30  Return to MAU as needed   Follow-up Information    Follow up with [facility]-EMERGENCY DEPT.   Specialty:  Emergency Medicine   Why:  If symptoms worsen   Contact information:   [address] [id number] [place] [phone]        Tawnya CrookHogan, Khilynn Borntreger Donovan 03/11/2015, 9:02 PM

## 2015-03-11 NOTE — MAU Note (Signed)
Pt states that she is having stomach and pelvic pain that started yesterday. Denies vag bleeding or discharge. LMP: was in August or September-last period was shorter than normal. Took UPT at home about a week ago and was negative. Having some nausea but no vomiting. Having increase in frequency with urination with some pain today. Has not taken anything for pain.

## 2015-03-11 NOTE — Discharge Instructions (Signed)

## 2015-03-12 LAB — GC/CHLAMYDIA PROBE AMP (~~LOC~~) NOT AT ARMC
Chlamydia: NEGATIVE
Neisseria Gonorrhea: NEGATIVE

## 2015-03-12 LAB — HIV ANTIBODY (ROUTINE TESTING W REFLEX): HIV SCREEN 4TH GENERATION: NONREACTIVE

## 2015-03-12 LAB — RPR: RPR: NONREACTIVE

## 2015-05-11 ENCOUNTER — Encounter (HOSPITAL_COMMUNITY): Payer: Self-pay | Admitting: *Deleted

## 2015-05-11 ENCOUNTER — Emergency Department (HOSPITAL_COMMUNITY)
Admission: EM | Admit: 2015-05-11 | Discharge: 2015-05-11 | Disposition: A | Discharge status: Home / Self Care | Payer: Medicaid Other | Attending: Emergency Medicine | Admitting: Emergency Medicine

## 2015-05-11 DIAGNOSIS — Z880 Allergy status to penicillin: Secondary | ICD-10-CM | POA: Insufficient documentation

## 2015-05-11 DIAGNOSIS — R110 Nausea: Secondary | ICD-10-CM

## 2015-05-11 DIAGNOSIS — Z79899 Other long term (current) drug therapy: Secondary | ICD-10-CM | POA: Insufficient documentation

## 2015-05-11 DIAGNOSIS — R42 Dizziness and giddiness: Secondary | ICD-10-CM | POA: Insufficient documentation

## 2015-05-11 DIAGNOSIS — R112 Nausea with vomiting, unspecified: Secondary | ICD-10-CM | POA: Insufficient documentation

## 2015-05-11 DIAGNOSIS — R51 Headache: Secondary | ICD-10-CM | POA: Insufficient documentation

## 2015-05-11 DIAGNOSIS — R102 Pelvic and perineal pain: Secondary | ICD-10-CM | POA: Insufficient documentation

## 2015-05-11 DIAGNOSIS — R109 Unspecified abdominal pain: Secondary | ICD-10-CM | POA: Insufficient documentation

## 2015-05-11 DIAGNOSIS — Z3202 Encounter for pregnancy test, result negative: Secondary | ICD-10-CM | POA: Insufficient documentation

## 2015-05-11 LAB — URINALYSIS, ROUTINE W REFLEX MICROSCOPIC
BILIRUBIN URINE: NEGATIVE
GLUCOSE, UA: NEGATIVE mg/dL
HGB URINE DIPSTICK: NEGATIVE
Ketones, ur: NEGATIVE mg/dL
Leukocytes, UA: NEGATIVE
Nitrite: NEGATIVE
PH: 6.5 (ref 5.0–8.0)
Protein, ur: NEGATIVE mg/dL
Specific Gravity, Urine: 1.023 (ref 1.005–1.030)

## 2015-05-11 LAB — CBC
HEMATOCRIT: 37.4 % (ref 36.0–46.0)
HEMOGLOBIN: 11.5 g/dL — AB (ref 12.0–15.0)
MCH: 27.6 pg (ref 26.0–34.0)
MCHC: 30.7 g/dL (ref 30.0–36.0)
MCV: 89.9 fL (ref 78.0–100.0)
Platelets: 286 10*3/uL (ref 150–400)
RBC: 4.16 MIL/uL (ref 3.87–5.11)
RDW: 15 % (ref 11.5–15.5)
WBC: 7 10*3/uL (ref 4.0–10.5)

## 2015-05-11 LAB — COMPREHENSIVE METABOLIC PANEL
ALBUMIN: 3.2 g/dL — AB (ref 3.5–5.0)
ALT: 14 U/L (ref 14–54)
ANION GAP: 6 (ref 5–15)
AST: 16 U/L (ref 15–41)
Alkaline Phosphatase: 70 U/L (ref 38–126)
BUN: 9 mg/dL (ref 6–20)
CALCIUM: 8.9 mg/dL (ref 8.9–10.3)
CHLORIDE: 104 mmol/L (ref 101–111)
CO2: 28 mmol/L (ref 22–32)
Creatinine, Ser: 0.69 mg/dL (ref 0.44–1.00)
GFR calc Af Amer: >60 mL/min (ref >60)
GFR calc non Af Amer: >60 mL/min (ref >60)
GLUCOSE: 124 mg/dL — AB (ref 65–99)
POTASSIUM: 4.2 mmol/L (ref 3.5–5.1)
SODIUM: 138 mmol/L (ref 135–145)
Total Bilirubin: 0.5 mg/dL (ref 0.3–1.2)
Total Protein: 7.1 g/dL (ref 6.5–8.1)

## 2015-05-11 LAB — I-STAT BETA HCG BLOOD, ED (MC, WL, AP ONLY): I-stat hCG, quantitative: <5 m[IU]/mL (ref <5)

## 2015-05-11 LAB — LIPASE, BLOOD: LIPASE: 22 U/L (ref 11–51)

## 2015-05-11 MED ORDER — MECLIZINE HCL 25 MG PO TABS: 25.0000 mg | ORAL_TABLET | Freq: Three times a day (TID) | ORAL | Status: DC | PRN

## 2015-05-11 MED ORDER — MECLIZINE HCL 25 MG PO TABS
25.0000 mg | ORAL_TABLET | Freq: Once | ORAL | Status: AC
  Administered 2015-05-11: 25 mg via ORAL
  Filled 2015-05-11: qty 1

## 2015-05-11 NOTE — ED Notes (Signed)
Pt remains monitored by blood pressure and pulse ox. Pt has visitor at bedside.

## 2015-05-11 NOTE — Discharge Instructions (Signed)
Dizziness Dizziness is a common problem. It makes you feel unsteady or lightheaded. You may feel like you are about to pass out (faint). Dizziness can lead to injury if you stumble or fall. Anyone can get dizzy, but dizziness is more common in older adults. This condition can be caused by a number of things, including:  Medicines.  Dehydration.  Illness. HOME CARE Following these instructions may help with your condition: Eating and Drinking  Drink enough fluid to keep your pee (urine) clear or pale yellow. This helps to keep you from getting dehydrated. Try to drink more clear fluids, such as water.  Do not drink alcohol.  Limit how much caffeine you drink or eat if told by your doctor.  Limit how much salt you drink or eat if told by your doctor. Activity  Avoid making quick movements.  When you stand up from sitting in a chair, steady yourself until you feel okay.  In the morning, first sit up on the side of the bed. When you feel okay, stand slowly while you hold onto something. Do this until you know that your balance is fine.  Move your legs often if you need to stand in one place for a long time. Tighten and relax your muscles in your legs while you are standing.  Do not drive or use heavy machinery if you feel dizzy.  Avoid bending down if you feel dizzy. Place items in your home so that they are easy for you to reach without leaning over. Lifestyle  Do not use any tobacco products, including cigarettes, chewing tobacco, or electronic cigarettes. If you need help quitting, ask your doctor.  Try to lower your stress level, such as with yoga or meditation. Talk with your doctor if you need help. General Instructions  Watch your dizziness for any changes.  Take medicines only as told by your doctor. Talk with your doctor if you think that your dizziness is caused by a medicine that you are taking.  Tell a friend or a family member that you are feeling dizzy. If he or  she notices any changes in your behavior, have this person call your doctor.  Keep all follow-up visits as told by your doctor. This is important. GET HELP IF:  Your dizziness does not go away.  Your dizziness or light-headedness gets worse.  You feel sick to your stomach (nauseous).  You have trouble hearing.  You have new symptoms.  You are unsteady on your feet or you feel like the room is spinning. GET HELP RIGHT AWAY IF:  You throw up (vomit) or have diarrhea and are unable to eat or drink anything.  You have trouble:  Talking.  Walking.  Swallowing.  Using your arms, hands, or legs.  You feel generally weak.  You are not thinking clearly or you have trouble forming sentences. It may take a friend or family member to notice this.  You have:  Chest pain.  Pain in your belly (abdomen).  Shortness of breath.  Sweating.  Your vision changes.  You are bleeding.  You have a headache.  You have neck pain or a stiff neck.  You have a fever.   This information is not intended to replace advice given to you by your health care provider. Make sure you discuss any questions you have with your health care provider.   Document Released: 04/21/2011 Document Revised: 09/16/2014 Document Reviewed: 04/28/2014 Elsevier Interactive Patient Education 2016 [employer].  Teaching laboratory technician Self-Care WHAT  IS THE EPLEY MANEUVER? The Epley maneuver is an exercise you can do to relieve symptoms of benign paroxysmal positional vertigo (BPPV). This condition is often just referred to as vertigo. BPPV is caused by the movement of tiny crystals (canaliths) inside your inner ear. The accumulation and movement of canaliths in your inner ear causes a sudden spinning sensation (vertigo) when you move your head to certain positions. Vertigo usually lasts about 30 seconds. BPPV usually occurs in just one ear. If you get vertigo when you lie on your left side, you probably have BPPV in your  left ear. Your health care provider can tell you which ear is involved.  BPPV may be caused by a head injury. Many people older than 50 get BPPV for unknown reasons. If you have been diagnosed with BPPV, your health care provider may teach you how to do this maneuver. BPPV is not life threatening (benign) and usually goes away in time.  WHEN SHOULD I PERFORM THE EPLEY MANEUVER? You can do this maneuver at home whenever you have symptoms of vertigo. You may do the Epley maneuver up to 3 times a day until your symptoms of vertigo go away. HOW SHOULD I DO THE EPLEY MANEUVER?  Sit on the edge of a bed or table with your back straight. Your legs should be extended or hanging over the edge of the bed or table.   Turn your head halfway toward the affected ear.   Lie backward quickly with your head turned until you are lying flat on your back. You may want to position a pillow under your shoulders.   Hold this position for 30 seconds. You may experience an attack of vertigo. This is normal. Hold this position until the vertigo stops.  Then turn your head to the opposite direction until your unaffected ear is facing the floor.   Hold this position for 30 seconds. You may experience an attack of vertigo. This is normal. Hold this position until the vertigo stops.  Now turn your whole body to the same side as your head. Hold for another 30 seconds.   You can then sit back up. ARE THERE RISKS TO THIS MANEUVER? In some cases, you may have other symptoms (such as changes in your vision, weakness, or numbness). If you have these symptoms, stop doing the maneuver and call your health care provider. Even if doing these maneuvers relieves your vertigo, you may still have dizziness. Dizziness is the sensation of light-headedness but without the sensation of movement. Even though the Epley maneuver may relieve your vertigo, it is possible that your symptoms will return within 5 years. WHAT SHOULD I DO AFTER  THIS MANEUVER? After doing the Epley maneuver, you can return to your normal activities. Ask your doctor if there is anything you should do at home to prevent vertigo. This may include:  Sleeping with two or more pillows to keep your head elevated.  Not sleeping on the side of your affected ear.  Getting up slowly from bed.  Avoiding sudden movements during the day.  Avoiding extreme head movement, like looking up or bending over.  Wearing a cervical collar to prevent sudden head movements. WHAT SHOULD I DO IF MY SYMPTOMS GET WORSE? Call your health care provider if your vertigo gets worse. Call your provider right way if you have other symptoms, including:   Nausea.  Vomiting.  Headache.  Weakness.  Numbness.  Vision changes.   This information is not intended to replace advice  given to you by your health care provider. Make sure you discuss any questions you have with your health care provider.   Document Released: 05/07/2013 Document Reviewed: 05/07/2013 Elsevier Interactive Patient Education [employer]  Vertigo Vertigo means you feel like you or your surroundings are moving when they are not. Vertigo can be dangerous if it occurs when you are at work, driving, or performing difficult activities.  CAUSES  Vertigo occurs when there is a conflict of signals sent to your brain from the visual and sensory systems in your body. There are many different causes of vertigo, including:  Infections, especially in the inner ear.  A bad reaction to a drug or misuse of alcohol and medicines.  Withdrawal from drugs or alcohol.  Rapidly changing positions, such as lying down or rolling over in bed.  A migraine headache.  Decreased blood flow to the brain.  Increased pressure in the brain from a head injury, infection, tumor, or bleeding. SYMPTOMS  You may feel as though the world is spinning around or you are falling to the ground. Because your balance is upset,  vertigo can cause nausea and vomiting. You may have involuntary eye movements (nystagmus). DIAGNOSIS  Vertigo is usually diagnosed by physical exam. If the cause of your vertigo is unknown, your caregiver may perform imaging tests, such as an MRI scan (magnetic resonance imaging). TREATMENT  Most cases of vertigo resolve on their own, without treatment. Depending on the cause, your caregiver may prescribe certain medicines. If your vertigo is related to body position issues, your caregiver may recommend movements or procedures to correct the problem. In rare cases, if your vertigo is caused by certain inner ear problems, you may need surgery. HOME CARE INSTRUCTIONS   Follow your caregiver's instructions.  Avoid driving.  Avoid operating heavy machinery.  Avoid performing any tasks that would be dangerous to you or others during a vertigo episode.  Tell your caregiver if you notice that certain medicines seem to be causing your vertigo. Some of the medicines used to treat vertigo episodes can actually make them worse in some people. SEEK IMMEDIATE MEDICAL CARE IF:   Your medicines do not relieve your vertigo or are making it worse.  You develop problems with talking, walking, weakness, or using your arms, hands, or legs.  You develop severe headaches.  Your nausea or vomiting continues or gets worse.  You develop visual changes.  A family member notices behavioral changes.  Your condition gets worse. MAKE SURE YOU:  Understand these instructions.  Will watch your condition.  Will get help right away if you are not doing well or get worse.   This information is not intended to replace advice given to you by your health care provider. Make sure you discuss any questions you have with your health care provider.   Document Released: 02/09/2005 Document Revised: 07/25/2011 Document Reviewed: 08/25/2014 Elsevier Interactive Patient Education [employer]

## 2015-05-11 NOTE — ED Provider Notes (Signed)
CSN: [id number]     Arrival date & time 05/11/15  1427 History   First MD Initiated Contact with Patient 05/11/15 1946     Chief Complaint  Patient presents with  . Dizziness  . Nausea  . Pelvic Pain     (Consider location/radiation/quality/duration/timing/severity/associated sxs/prior Treatment) Patient is a 29 y.o. female presenting with dizziness. The history is provided by the patient.  Dizziness Quality:  Room spinning Severity:  Mild Onset quality:  Gradual Timing:  Intermittent Progression:  Waxing and waning Chronicity:  New Context: not when bending over, not with bowel movement, not with ear pain, not with eye movement, not with head movement, not with inactivity, not with loss of consciousness, not with medication, not with physical activity and not when standing up   Relieved by:  None tried Worsened by:  Nothing Ineffective treatments:  None tried Associated symptoms: headaches and nausea   Associated symptoms: no blood in stool, no chest pain, no diarrhea, no hearing loss, no palpitations, no shortness of breath, no syncope, no tinnitus, no vision changes, no vomiting and no weakness   Risk factors: no hx of vertigo, no multiple medications and no new medications     History reviewed. No pertinent past medical history. Past Surgical History  Procedure Laterality Date  . Cesarean section     History reviewed. No pertinent family history. Social History  Substance Use Topics  . Smoking status: Never Smoker   . Smokeless tobacco: None  . Alcohol Use: Yes     Comment: occasional   OB History    Gravida Para Term Preterm AB TAB SAB Ectopic Multiple Living   2 1 1  0 1 0 1 0 0 1     Review of Systems  Constitutional: Negative for fatigue.  HENT: Negative for hearing loss and tinnitus.   Eyes: Negative for pain.  Respiratory: Negative for chest tightness and shortness of breath.   Cardiovascular: Negative for chest pain, palpitations and syncope.   Gastrointestinal: Positive for nausea. Negative for vomiting, diarrhea and blood in stool.  Genitourinary: Negative for dysuria.  Musculoskeletal: Negative for back pain.  Neurological: Positive for dizziness and headaches. Negative for weakness.      Allergies  Amoxicillin; Morphine and related; Penicillins; Pineapple; and Tomato  Home Medications   Prior to Admission medications   Medication Sig Start Date End Date Taking? Authorizing Provider  ibuprofen (ADVIL,MOTRIN) 800 MG tablet Take 1 tablet (800 mg total) by mouth every 8 (eight) hours as needed. 03/11/15  Yes Armando ReichertHeather D Hogan, CNM  Prenatal Vit-Fe Fumarate-FA (PRENATAL MULTIVITAMIN) TABS tablet Take 1 tablet by mouth daily at 12 noon.   Yes Historical Provider, MD  meclizine (ANTIVERT) 25 MG tablet Take 1 tablet (25 mg total) by mouth 3 (three) times daily as needed for dizziness. 05/11/15   Stacy GardnerAndrew Yashica Sterbenz, MD   BP 109/83 mmHg  Pulse 68  Temp(Src) 98.3 F (36.8 C) (Oral)  Resp 19  SpO2 99%  LMP 03/20/2015 Physical Exam  Constitutional: She appears well-developed and well-nourished. No distress.  HENT:  Head: Head is without Battle's sign, without abrasion and without laceration.  Mouth/Throat: Oropharynx is clear and moist.  Eyes: Conjunctivae and EOM are normal. Pupils are equal, round, and reactive to light.  Neck: Normal range of motion.  Cardiovascular: Normal rate, regular rhythm and normal pulses.   No murmur heard. Pulmonary/Chest: Effort normal and breath sounds normal.  Abdominal: Normal appearance. There is no tenderness. There is no rigidity, no rebound, no  guarding, no CVA tenderness, no tenderness at McBurney's point and negative Murphy's sign. No hernia.  Neurological: She is alert. She has normal strength. No cranial nerve deficit or sensory deficit. Coordination normal. GCS eye subscore is 4. GCS verbal subscore is 5. GCS motor subscore is 6.  Normal finger to nose and heel to shin.  Symptoms worsened  with Dix-Halpike maneuver  Skin: No rash noted.  Psychiatric: Her speech is normal and behavior is normal. Cognition and memory are normal.    ED Course  Procedures (including critical care time) Labs Review Labs Reviewed  COMPREHENSIVE METABOLIC PANEL - Abnormal; Notable for the following:    Glucose, Bld 124 (*)    Albumin 3.2 (*)    All other components within normal limits  CBC - Abnormal; Notable for the following:    Hemoglobin 11.5 (*)    All other components within normal limits  URINALYSIS, ROUTINE W REFLEX MICROSCOPIC (NOT AT [facility]) - Abnormal; Notable for the following:    APPearance HAZY (*)    All other components within normal limits  LIPASE, BLOOD  I-STAT BETA HCG BLOOD, ED ([facility], [facility], [facility] ONLY)    Imaging Review No results found. I have personally reviewed and evaluated these images and lab results as part of my medical decision-making.   EKG Interpretation None      MDM   Final diagnoses:  Dizziness  Nausea  Non-intractable vomiting with nausea, vomiting of unspecified type    29 year old African-American female with no significant past medical history presents in the setting of dizziness and nausea. Patient reports symptoms going on intermittently for several weeks. She reports at times she feels like "I'm pregnant even though I know I'm not pregnant". Patient denies significant headache, vision change, chest pain, shortness of breath, or diarrhea. She reports mild abdominal pain. She denies any vaginal bleeding or vaginal discharge. She denies any dysuria. Patient denies anything makes symptoms better or worse.  Pt describes dizziness as room spinning consistent with vertigo.  On evaluation patient is resting comfortably on examination and texting on cell phone. Patient's vital signs within normal limits. Patient had no focal neurologic deficits on examination and symptoms worsen the Dix-Hallpike maneuver. Patient normal finger to nose examination at this point  I believe a central cause of her vertigo is unlikely. Patient's abdomen is soft nontender nondistended and examination. As patient has no vaginal bleeding or vaginal discharge at this time do not believe pelvic exam is necessary. Patient had laboratory analysis performed which reveals no signs of infection, significant electrolyte abnormalities. Patient given meclizine and by mouth challenge and reassess.  On reassessment patient reports significant improvement in dizziness and able to tolerate by mouth intake without complications. Considering findings believe this is likely peripheral vertigo in nature. Patient given written instructions on maneuvers to improve symptoms and advised follow up PCP for close evaluation. Patient given short course of meclizine. Patient stable at time of discharge and given strict return percussion.  Attending has seen and evaluated patient and Dr. Hyacinth Meeker is in agreement with plan.    Stacy Gardner, MD 05/11/15 2357  Eber Hong, MD 05/12/15 [id number]

## 2015-05-11 NOTE — ED Notes (Signed)
PO challenge started and pt tolerated her food.

## 2015-05-11 NOTE — ED Notes (Signed)
Pt placed into gown and on monitor upon arrival to room. Pt remains monitored by blood pressure, pulse ox, and 5 lead. Pt has a visitor at bedside.

## 2015-05-11 NOTE — ED Notes (Signed)
Pt sts she is now having cp. sts she gets cp from time to time. ekg completed.

## 2015-05-11 NOTE — ED Notes (Signed)
Pt admits to dizziness, nausea and pelvic pain x2 weeks - denies vaginal bleeding/discharge, vomiting diarrhea or fever.

## 2015-07-13 ENCOUNTER — Other Ambulatory Visit: Payer: Self-pay | Admitting: Physician Assistant

## 2015-07-13 DIAGNOSIS — N6320 Unspecified lump in the left breast, unspecified quadrant: Principal | ICD-10-CM

## 2015-07-13 DIAGNOSIS — N6325 Unspecified lump in the left breast, overlapping quadrants: Secondary | ICD-10-CM

## 2015-07-16 ENCOUNTER — Ambulatory Visit
Admission: RE | Admit: 2015-07-16 | Discharge: 2015-07-16 | Disposition: A | Discharge status: Home / Self Care | Payer: BLUE CROSS/BLUE SHIELD | Source: Ambulatory Visit | Attending: Family Medicine | Admitting: Family Medicine

## 2015-07-16 DIAGNOSIS — N6320 Unspecified lump in the left breast, unspecified quadrant: Principal | ICD-10-CM

## 2015-07-16 DIAGNOSIS — N6325 Unspecified lump in the left breast, overlapping quadrants: Secondary | ICD-10-CM

## 2015-08-02 ENCOUNTER — Inpatient Hospital Stay (HOSPITAL_COMMUNITY)
Admission: AD | Admit: 2015-08-02 | Discharge: 2015-08-02 | Disposition: A | Discharge status: Home / Self Care | Payer: BLUE CROSS/BLUE SHIELD | Source: Ambulatory Visit | Attending: Obstetrics and Gynecology | Admitting: Obstetrics and Gynecology

## 2015-08-02 ENCOUNTER — Encounter (HOSPITAL_COMMUNITY): Payer: Self-pay | Admitting: Family

## 2015-08-02 DIAGNOSIS — N912 Amenorrhea, unspecified: Secondary | ICD-10-CM | POA: Diagnosis present

## 2015-08-02 DIAGNOSIS — Z880 Allergy status to penicillin: Secondary | ICD-10-CM | POA: Diagnosis not present

## 2015-08-02 DIAGNOSIS — R110 Nausea: Secondary | ICD-10-CM | POA: Insufficient documentation

## 2015-08-02 DIAGNOSIS — Z885 Allergy status to narcotic agent status: Secondary | ICD-10-CM | POA: Insufficient documentation

## 2015-08-02 LAB — URINALYSIS, ROUTINE W REFLEX MICROSCOPIC
BILIRUBIN URINE: NEGATIVE
Glucose, UA: NEGATIVE mg/dL
Hgb urine dipstick: NEGATIVE
Ketones, ur: NEGATIVE mg/dL
LEUKOCYTES UA: NEGATIVE
NITRITE: NEGATIVE
PH: 5 (ref 5.0–8.0)
Protein, ur: NEGATIVE mg/dL

## 2015-08-02 LAB — POCT PREGNANCY, URINE: PREG TEST UR: NEGATIVE

## 2015-08-02 NOTE — MAU Note (Signed)
Patient presents to MAU with concern that she may be pregnant; reports having done multiple HPT that were negative. LMP Jan 14; endorse shorter than other periods. Patient states "would like to make sure the baby is positioned correctly". Endorse nausea in mornings. Denies abdominal pain or bleeding.

## 2015-08-02 NOTE — MAU Provider Note (Signed)
History   [id number]   Chief Complaint  Patient presents with  . Possible Pregnancy    HPI Beth Carrillo is a 30 y.o. female  G2P1011 here with report of amenorrhea x 2 months.  Denies abdominal pain or vaginal bleeding.  Intermittent dysuria.   +report of nausea, denies vomiting.  Also reports +headaches.    No LMP recorded. Patient is not currently having periods (Reason: Needs Pregnancy Test).  OB History  Gravida Para Term Preterm AB SAB TAB Ectopic Multiple Living  2 1 1  0 1 1 0 0 0 1    # Outcome Date GA Lbr Len/2nd Weight Sex Delivery Anes PTL Lv  2 SAB           1 Term               No past medical history on file.  No family history on file.  Social History   Social History  . Marital Status: Single    Spouse Name: N/A  . Number of Children: N/A  . Years of Education: N/A   Social History Main Topics  . Smoking status: Never Smoker   . Smokeless tobacco: None  . Alcohol Use: Yes     Comment: occasional  . Drug Use: No  . Sexual Activity: Yes    Birth Control/ Protection: Condom, None   Other Topics Concern  . None   Social History Narrative    Allergies  Allergen Reactions  . Amoxicillin Hives  . Morphine And Related Nausea And Vomiting  . Penicillins Hives    Has patient had a PCN reaction causing immediate rash, facial/tongue/throat swelling, SOB or lightheadedness with hypotension: Yes Has patient had a PCN reaction causing severe rash involving mucus membranes or skin necrosis: No Has patient had a PCN reaction that required hospitalization No Has patient had a PCN reaction occurring within the last 10 years: Yes If all of the above answers are "NO", then may proceed with Cephalosporin use.   Marland Kitchen. Pineapple Itching    Irritates the mouth   . Tomato Itching    Irritates the mouth    No current facility-administered medications on file prior to encounter.   Current Outpatient Prescriptions on File Prior to Encounter  Medication Sig  Dispense Refill  . ibuprofen (ADVIL,MOTRIN) 800 MG tablet Take 1 tablet (800 mg total) by mouth every 8 (eight) hours as needed. 30 tablet 0  . meclizine (ANTIVERT) 25 MG tablet Take 1 tablet (25 mg total) by mouth 3 (three) times daily as needed for dizziness. 30 tablet 0  . Prenatal Vit-Fe Fumarate-FA (PRENATAL MULTIVITAMIN) TABS tablet Take 1 tablet by mouth daily at 12 noon.       Review of Systems  Constitutional: Negative for fever and chills.  Gastrointestinal: Positive for nausea and abdominal pain. Negative for vomiting.  Genitourinary: Negative for vaginal bleeding, vaginal discharge and pelvic pain.  Musculoskeletal: Negative for back pain.  All other systems reviewed and are negative.    Physical Exam   Filed Vitals:   08/02/15 2058  BP: 131/87  Pulse: 84  Temp: 98 F (36.7 C)  TempSrc: Oral  Resp: 17  Height: 5' (1.524 m)  Weight: 260 lb (117.935 kg)  SpO2: 100%    Physical Exam  Constitutional: She is oriented to person, place, and time. She appears well-developed and well-nourished. No distress.  HENT:  Head: Normocephalic.  Neck: Normal range of motion. Neck supple.  Cardiovascular: Normal rate, regular rhythm and normal  heart sounds.   Respiratory: Effort normal and breath sounds normal. No respiratory distress.  Musculoskeletal: Normal range of motion. She exhibits no edema.  Neurological: She is alert and oriented to person, place, and time. She has normal reflexes.  Skin: Skin is warm and dry.    MAU Course  Procedures  MDM Results for orders placed or performed during the hospital encounter of 08/02/15 (from the past 24 hour(s))  Urinalysis, Routine w reflex microscopic (not at [facility])     Status: Abnormal   Collection Time: 08/02/15  9:02 PM  Result Value Ref Range   Color, Urine YELLOW YELLOW   APPearance CLEAR CLEAR   Specific Gravity, Urine >1.030 (H) 1.005 - 1.030   pH 5.0 5.0 - 8.0   Glucose, UA NEGATIVE NEGATIVE mg/dL   Hgb urine  dipstick NEGATIVE NEGATIVE   Bilirubin Urine NEGATIVE NEGATIVE   Ketones, ur NEGATIVE NEGATIVE mg/dL   Protein, ur NEGATIVE NEGATIVE mg/dL   Nitrite NEGATIVE NEGATIVE   Leukocytes, UA NEGATIVE NEGATIVE   Urine preg:  negative  Assessment and Plan  Nausea - unknown etiology Amenorrhea  Plan: Patient given results Follow-up with gyn regarding amenorrhea  Marlis Edelson, CNM 08/02/2015 9:55 PM

## 2015-09-16 ENCOUNTER — Emergency Department (HOSPITAL_COMMUNITY): Payer: BLUE CROSS/BLUE SHIELD

## 2015-09-16 ENCOUNTER — Emergency Department (HOSPITAL_COMMUNITY)
Admission: EM | Admit: 2015-09-16 | Discharge: 2015-09-16 | Disposition: A | Discharge status: Home / Self Care | Payer: BLUE CROSS/BLUE SHIELD | Attending: Emergency Medicine | Admitting: Emergency Medicine

## 2015-09-16 ENCOUNTER — Encounter (HOSPITAL_COMMUNITY): Payer: Self-pay | Admitting: Emergency Medicine

## 2015-09-16 DIAGNOSIS — Z3202 Encounter for pregnancy test, result negative: Secondary | ICD-10-CM | POA: Diagnosis not present

## 2015-09-16 DIAGNOSIS — R102 Pelvic and perineal pain: Secondary | ICD-10-CM | POA: Diagnosis present

## 2015-09-16 DIAGNOSIS — Z79899 Other long term (current) drug therapy: Secondary | ICD-10-CM | POA: Diagnosis not present

## 2015-09-16 DIAGNOSIS — E669 Obesity, unspecified: Secondary | ICD-10-CM | POA: Diagnosis not present

## 2015-09-16 DIAGNOSIS — N888 Other specified noninflammatory disorders of cervix uteri: Secondary | ICD-10-CM

## 2015-09-16 DIAGNOSIS — Z880 Allergy status to penicillin: Secondary | ICD-10-CM | POA: Diagnosis not present

## 2015-09-16 DIAGNOSIS — N939 Abnormal uterine and vaginal bleeding, unspecified: Secondary | ICD-10-CM | POA: Diagnosis not present

## 2015-09-16 HISTORY — DX: Obesity, unspecified: E66.9

## 2015-09-16 LAB — COMPREHENSIVE METABOLIC PANEL
ALT: 12 U/L — ABNORMAL LOW (ref 14–54)
ANION GAP: 6 (ref 5–15)
AST: 15 U/L (ref 15–41)
Albumin: 3.2 g/dL — ABNORMAL LOW (ref 3.5–5.0)
Alkaline Phosphatase: 63 U/L (ref 38–126)
BILIRUBIN TOTAL: 0.4 mg/dL (ref 0.3–1.2)
BUN: 8 mg/dL (ref 6–20)
CHLORIDE: 108 mmol/L (ref 101–111)
CO2: 26 mmol/L (ref 22–32)
Calcium: 9.1 mg/dL (ref 8.9–10.3)
Creatinine, Ser: 0.7 mg/dL (ref 0.44–1.00)
GFR calc Af Amer: >60 mL/min (ref >60)
GFR calc non Af Amer: >60 mL/min (ref >60)
GLUCOSE: 101 mg/dL — AB (ref 65–99)
POTASSIUM: 4.2 mmol/L (ref 3.5–5.1)
SODIUM: 140 mmol/L (ref 135–145)
TOTAL PROTEIN: 7.2 g/dL (ref 6.5–8.1)

## 2015-09-16 LAB — POC URINE PREG, ED: Preg Test, Ur: NEGATIVE

## 2015-09-16 LAB — URINALYSIS, ROUTINE W REFLEX MICROSCOPIC
Bilirubin Urine: NEGATIVE
Glucose, UA: NEGATIVE mg/dL
Ketones, ur: NEGATIVE mg/dL
NITRITE: NEGATIVE
PH: 6 (ref 5.0–8.0)
Protein, ur: NEGATIVE mg/dL
SPECIFIC GRAVITY, URINE: 1.022 (ref 1.005–1.030)

## 2015-09-16 LAB — CBC
HEMATOCRIT: 37.1 % (ref 36.0–46.0)
HEMOGLOBIN: 11.5 g/dL — AB (ref 12.0–15.0)
MCH: 27.3 pg (ref 26.0–34.0)
MCHC: 31 g/dL (ref 30.0–36.0)
MCV: 87.9 fL (ref 78.0–100.0)
Platelets: 296 10*3/uL (ref 150–400)
RBC: 4.22 MIL/uL (ref 3.87–5.11)
RDW: 14.9 % (ref 11.5–15.5)
WBC: 7.2 10*3/uL (ref 4.0–10.5)

## 2015-09-16 LAB — WET PREP, GENITAL
Sperm: NONE SEEN
Trich, Wet Prep: NONE SEEN
Yeast Wet Prep HPF POC: NONE SEEN

## 2015-09-16 LAB — URINE MICROSCOPIC-ADD ON

## 2015-09-16 LAB — LIPASE, BLOOD: Lipase: 23 U/L (ref 11–51)

## 2015-09-16 MED ORDER — ONDANSETRON 4 MG PO TBDP
4.0000 mg | ORAL_TABLET | Freq: Once | ORAL | Status: AC
  Administered 2015-09-16: 4 mg via ORAL
  Filled 2015-09-16: qty 1

## 2015-09-16 MED ORDER — TRAMADOL HCL 50 MG PO TABS: 50.0000 mg | ORAL_TABLET | Freq: Four times a day (QID) | ORAL | Status: DC | PRN

## 2015-09-16 MED ORDER — AZITHROMYCIN 250 MG PO TABS
1000.0000 mg | ORAL_TABLET | Freq: Once | ORAL | Status: AC
  Administered 2015-09-16: 1000 mg via ORAL
  Filled 2015-09-16: qty 4

## 2015-09-16 MED ORDER — CEFTRIAXONE SODIUM 250 MG IJ SOLR
250.0000 mg | Freq: Once | INTRAMUSCULAR | Status: AC
  Administered 2015-09-16: 250 mg via INTRAMUSCULAR
  Filled 2015-09-16: qty 250

## 2015-09-16 MED ORDER — IBUPROFEN 800 MG PO TABS: 800.0000 mg | ORAL_TABLET | Freq: Three times a day (TID) | ORAL | Status: DC | PRN

## 2015-09-16 MED ORDER — LIDOCAINE HCL (PF) 1 % IJ SOLN
INTRAMUSCULAR | Status: AC
  Administered 2015-09-16: 11:00:00
  Filled 2015-09-16: qty 5

## 2015-09-16 MED ORDER — HYDROCODONE-ACETAMINOPHEN 5-325 MG PO TABS
1.0000 | ORAL_TABLET | Freq: Once | ORAL | Status: AC
  Administered 2015-09-16: 1 via ORAL
  Filled 2015-09-16: qty 1

## 2015-09-16 NOTE — ED Notes (Addendum)
Pt is G 2 P1 A 1 L1, states  Last period was jan,cramping started on Monday and vag bleeding started yesterday pain goes from abd to back,  Last unprotected intercourse was last week she states

## 2015-09-16 NOTE — ED Notes (Signed)
Pt. report low abdominal pain and low back pain radiating to pelvis onset yesterday , vaginal bleeding this morning , denies nausea or vomitting / no diarrhea or fever .

## 2015-09-16 NOTE — ED Provider Notes (Signed)
CSN: [id number]     Arrival date & time 09/16/15  0409 History   First MD Initiated Contact with Patient 09/16/15 [phone]     Chief Complaint  Patient presents with  . Abdominal Pain  . Vaginal Bleeding     (Consider location/radiation/quality/duration/timing/severity/associated sxs/prior Treatment) HPI Patient presents to the emergency department with lower pelvic pain and an episode of vaginal bleeding.  The patient states that she was having crampy, intermittent abdominal pain in her lower abdomen yesterday and then yesterday afternoon noticed some blood per her vaginal area.  The patient states that she has not had a period since January and is not currently taking any birth control.  The patient states that nothing seems make the condition better or worse.  Patient denies taking any medications prior to arrival.The patient denies chest pain, shortness of breath, headache,blurred vision, neck pain, fever, cough, weakness, numbness, dizziness, anorexia, edema,  vomiting, diarrhea, rash, back pain, dysuria, hematemesis, bloody stool, near syncope, or syncope. Past Medical History  Diagnosis Date  . Obesity    Past Surgical History  Procedure Laterality Date  . Cesarean section     No family history on file. Social History  Substance Use Topics  . Smoking status: Never Smoker   . Smokeless tobacco: None  . Alcohol Use: Yes     Comment: occasional   OB History    Gravida Para Term Preterm AB TAB SAB Ectopic Multiple Living   2 1 1  0 1 0 1 0 0 1     Review of Systems  All other systems negative except as documented in the HPI. All pertinent positives and negatives as reviewed in the HPI.  Allergies  Amoxicillin; Morphine and related; Penicillins; Pineapple; and Tomato  Home Medications   Prior to Admission medications   Medication Sig Start Date End Date Taking? Authorizing Provider  Prenatal Vit-Fe Fumarate-FA (PRENATAL MULTIVITAMIN) TABS tablet Take 1 tablet by mouth daily  at 12 noon.   Yes Historical Provider, MD   BP 101/76 mmHg  Pulse 74  Temp(Src) 98.9 F (37.2 C) (Oral)  Resp 18  Ht 5' (1.524 m)  Wt 131.543 kg  BMI 56.64 kg/m2  SpO2 100% Physical Exam  Constitutional: She is oriented to person, place, and time. She appears well-developed and well-nourished. No distress.  HENT:  Head: Normocephalic and atraumatic.  Mouth/Throat: Oropharynx is clear and moist.  Eyes: Pupils are equal, round, and reactive to light.  Neck: Normal range of motion. Neck supple.  Cardiovascular: Normal rate, regular rhythm and normal heart sounds.  Exam reveals no gallop and no friction rub.   No murmur heard. Pulmonary/Chest: Effort normal and breath sounds normal. No respiratory distress. She has no wheezes.  Abdominal: Soft. Bowel sounds are normal. She exhibits no distension. There is tenderness. There is no rebound.  Genitourinary: Cervix exhibits motion tenderness and discharge. Cervix exhibits no friability. Right adnexum displays tenderness. Left adnexum displays tenderness.  Neurological: She is alert and oriented to person, place, and time. She exhibits normal muscle tone. Coordination normal.  Skin: Skin is warm and dry. No rash noted. No erythema.  Psychiatric: She has a normal mood and affect. Her behavior is normal.  Nursing note and vitals reviewed.   ED Course  Procedures (including critical care time) Labs Review Labs Reviewed  WET PREP, GENITAL - Abnormal; Notable for the following:    Clue Cells Wet Prep HPF POC PRESENT (*)    WBC, Wet Prep HPF POC FEW (*)  All other components within normal limits  COMPREHENSIVE METABOLIC PANEL - Abnormal; Notable for the following:    Glucose, Bld 101 (*)    Albumin 3.2 (*)    ALT 12 (*)    All other components within normal limits  CBC - Abnormal; Notable for the following:    Hemoglobin 11.5 (*)    All other components within normal limits  URINALYSIS, ROUTINE W REFLEX MICROSCOPIC (NOT AT [facility]) -  Abnormal; Notable for the following:    APPearance CLOUDY (*)    Hgb urine dipstick LARGE (*)    Leukocytes, UA SMALL (*)    All other components within normal limits  URINE MICROSCOPIC-ADD ON - Abnormal; Notable for the following:    Squamous Epithelial / LPF 0-5 (*)    Bacteria, UA RARE (*)    All other components within normal limits  LIPASE, BLOOD  POC URINE PREG, ED  GC/CHLAMYDIA PROBE AMP ([place]) NOT AT [facility]    Imaging Review [place] Transvaginal Non-ob  09/16/2015  CLINICAL DATA:  Pelvic pain.  Abnormal menstrual cycle. EXAM: TRANSABDOMINAL AND TRANSVAGINAL ULTRASOUND OF PELVIS TECHNIQUE: Both transabdominal and transvaginal ultrasound examinations of the pelvis were performed. Transabdominal technique was performed for global imaging of the pelvis including uterus, ovaries, adnexal regions, and pelvic cul-de-sac. It was necessary to proceed with endovaginal exam following the transabdominal exam to visualize the endometrium. COMPARISON:  06/11/2014. FINDINGS: Uterus Measurements: 9.9 x 3.8 x 4.5 cm. 4.1 x 3.5 x 3.9 cm heterogeneous cervical mass with solid and cystic components. Cervical cancer could present in this fashion. Direct visualization is suggested. Endometrium Thickness: 5.1 mm.  No focal abnormality visualized. Right ovary Measurements: 3.6 x 2.5 x 2.1 cm. Normal appearance/no adnexal mass. Left ovary Measurements: 2.7 x 1.8 x 1.5 cm. Normal appearance/no adnexal mass. Other findings No abnormal free fluid. IMPRESSION: 4.1 x 3.5 x 3.9 complex cervical mass with cystic and solid component. Cervical cancer could present in this fashion. Direct visualization suggested. Electronically Signed   By: Maisie Fushomas  Register   On: 09/16/2015 09:00   [place] Pelvis Complete  09/16/2015  CLINICAL DATA:  Pelvic pain.  Abnormal menstrual cycle. EXAM: TRANSABDOMINAL AND TRANSVAGINAL ULTRASOUND OF PELVIS TECHNIQUE: Both transabdominal and transvaginal ultrasound examinations of the pelvis were performed.  Transabdominal technique was performed for global imaging of the pelvis including uterus, ovaries, adnexal regions, and pelvic cul-de-sac. It was necessary to proceed with endovaginal exam following the transabdominal exam to visualize the endometrium. COMPARISON:  06/11/2014. FINDINGS: Uterus Measurements: 9.9 x 3.8 x 4.5 cm. 4.1 x 3.5 x 3.9 cm heterogeneous cervical mass with solid and cystic components. Cervical cancer could present in this fashion. Direct visualization is suggested. Endometrium Thickness: 5.1 mm.  No focal abnormality visualized. Right ovary Measurements: 3.6 x 2.5 x 2.1 cm. Normal appearance/no adnexal mass. Left ovary Measurements: 2.7 x 1.8 x 1.5 cm. Normal appearance/no adnexal mass. Other findings No abnormal free fluid. IMPRESSION: 4.1 x 3.5 x 3.9 complex cervical mass with cystic and solid component. Cervical cancer could present in this fashion. Direct visualization suggested. Electronically Signed   By: Maisie Fushomas  Register   On: 09/16/2015 09:00   I have personally reviewed and evaluated these images and lab results as part of my medical decision-making.   EKG Interpretation None      MDM   Final diagnoses:  None    Patient is advised she will need close follow-up with her GYN doctor, which she has an appointment in May 22.  She  states that she understands the seriousness of this since she has had atypical cells in the past on her cervix that were frozen, as this could be cancerous and the patient was in understanding of this    Charlestine Night, PA-C 09/16/15 1105  Benjiman Core, MD 09/16/15 [phone]

## 2015-09-16 NOTE — Discharge Instructions (Signed)
Return here as needed.  Call your GYN doctor and advised of the findings of this cervical growth/mass abnormality

## 2015-09-16 NOTE — ED Notes (Signed)
Pt given crackers.  

## 2015-09-17 LAB — GC/CHLAMYDIA PROBE AMP (~~LOC~~) NOT AT ARMC
Chlamydia: NEGATIVE
Neisseria Gonorrhea: NEGATIVE

## 2016-05-23 ENCOUNTER — Other Ambulatory Visit: Payer: Self-pay | Admitting: Obstetrics & Gynecology

## 2016-05-24 LAB — CYTOLOGY - PAP

## 2016-09-13 IMAGING — US US PELV - US TRANSVAGINAL
1 series · 13 of 25 positions shown · non-contrast
Comparison: 02/27/2013.

CLINICAL DATA: 28-year-old female with bilateral pelvic pain for 1
week. Negative urine pregnancy test. LMP 05/29/2014. Initial
encounter.



[Series 1: us pelv - us transvaginal · 0.21mm/px · 13 of 84 slices shown]
[im 1/84]
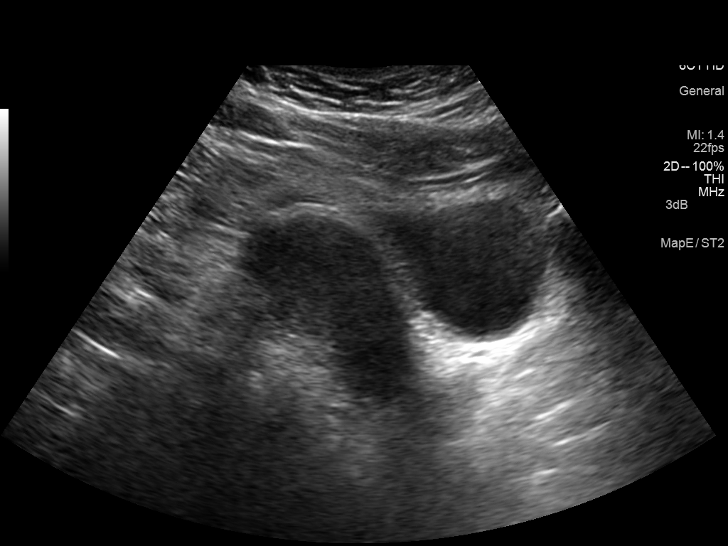
[im 7/84]
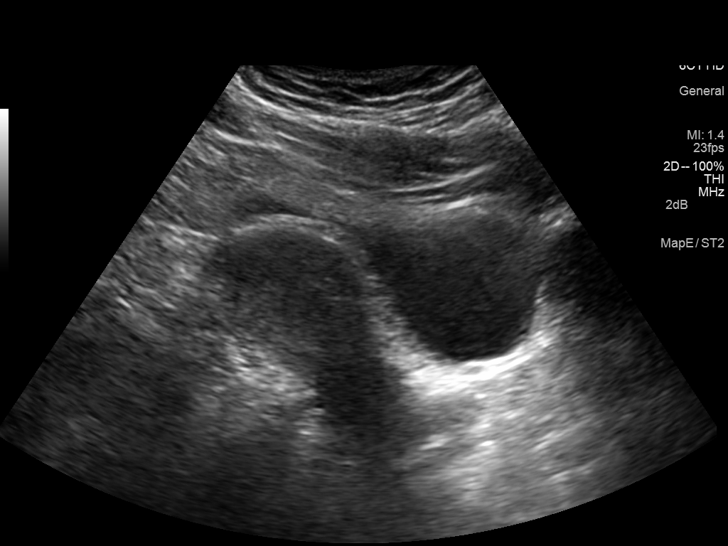
[im 14/84]
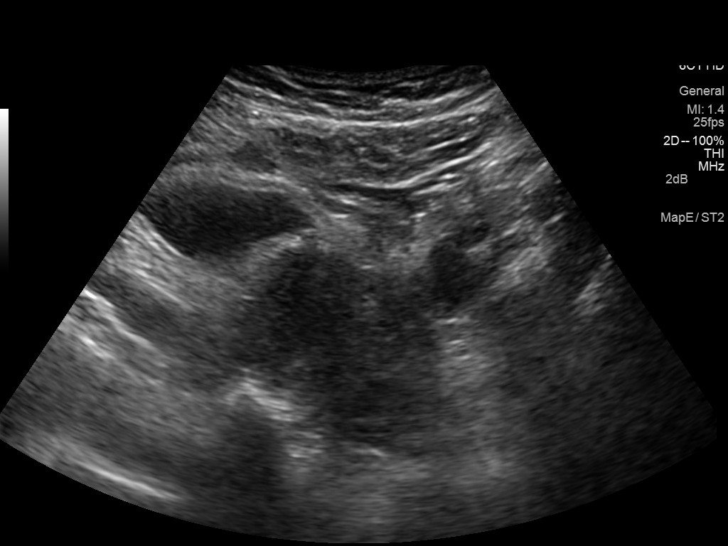
[im 21/84]
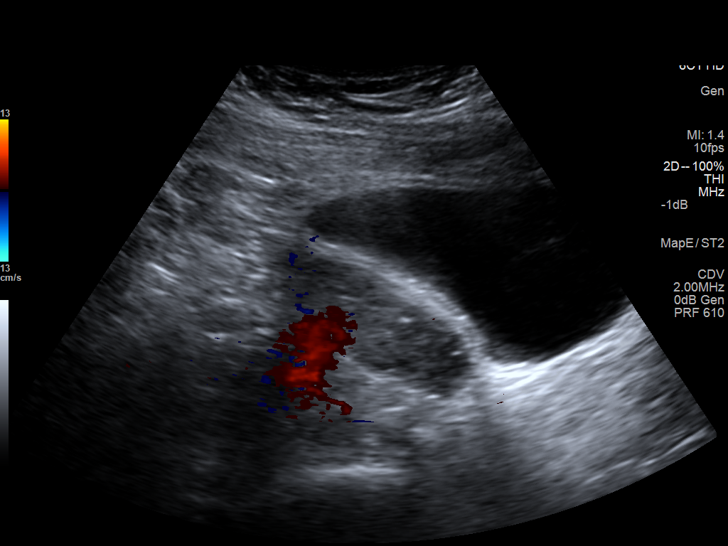
[im 28/84]
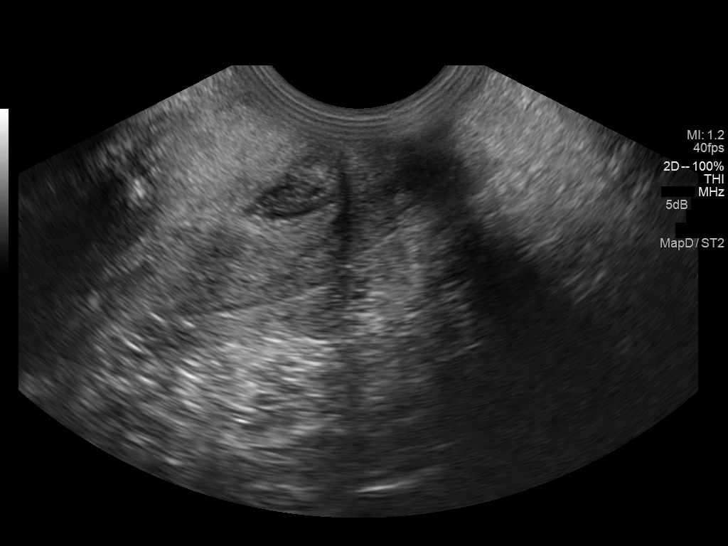
[im 35/84]
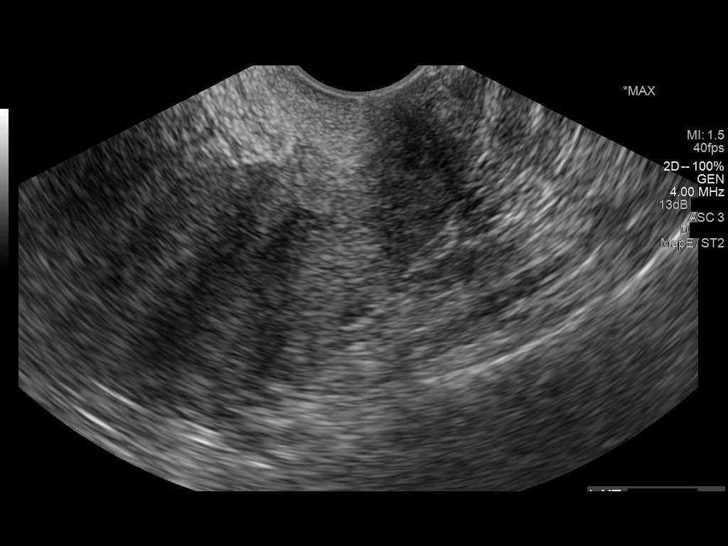
[im 42/84]
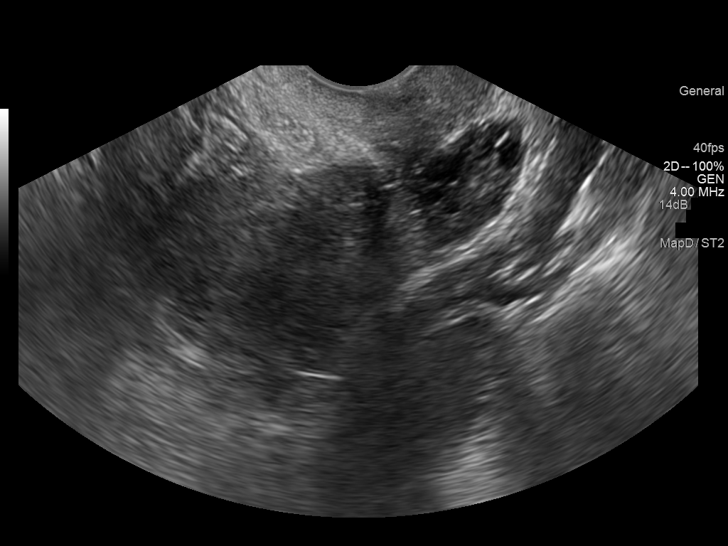
[im 49/84]
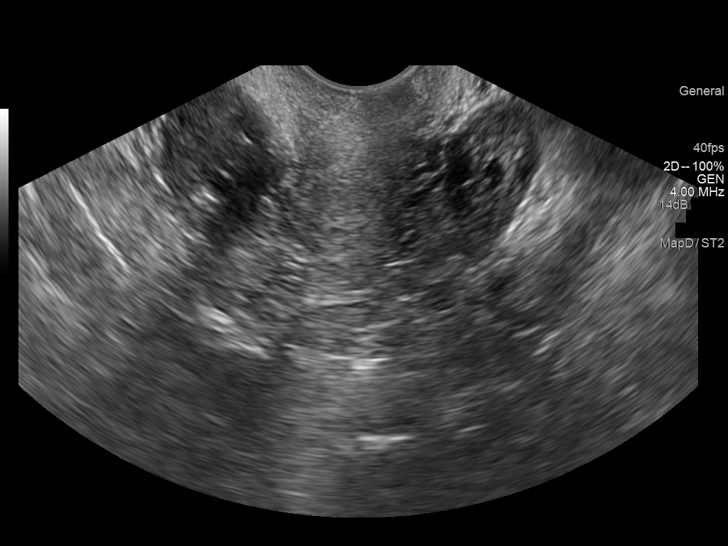
[im 56/84]
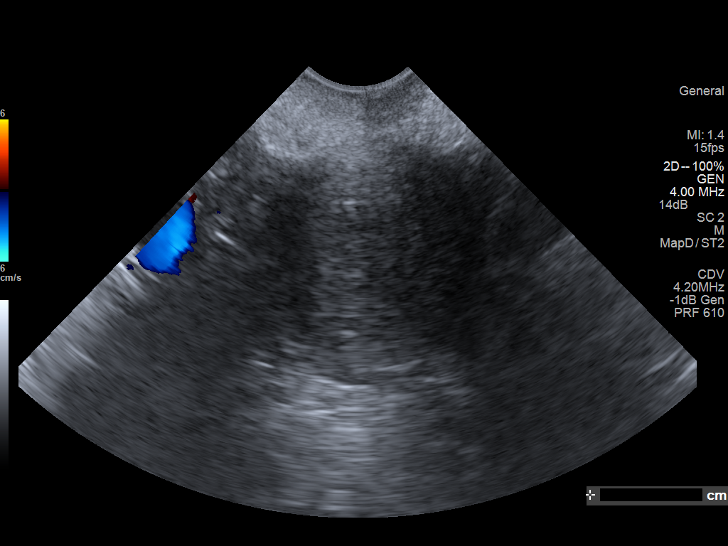
[im 63/84]
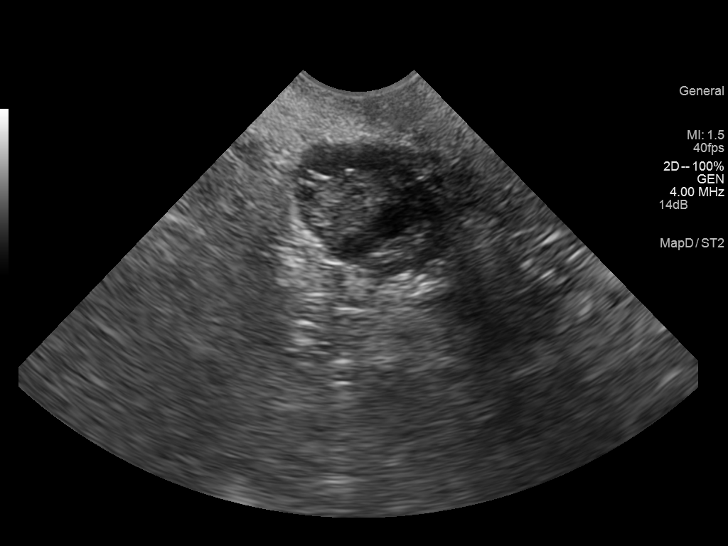
[im 70/84]
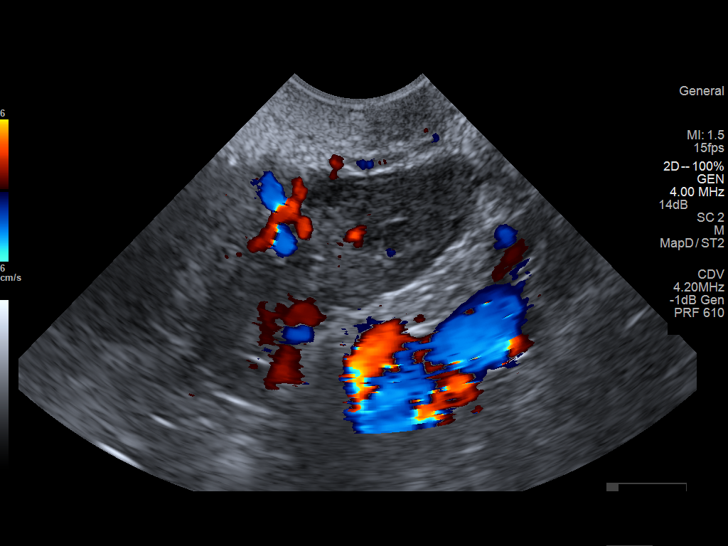
[im 77/84]
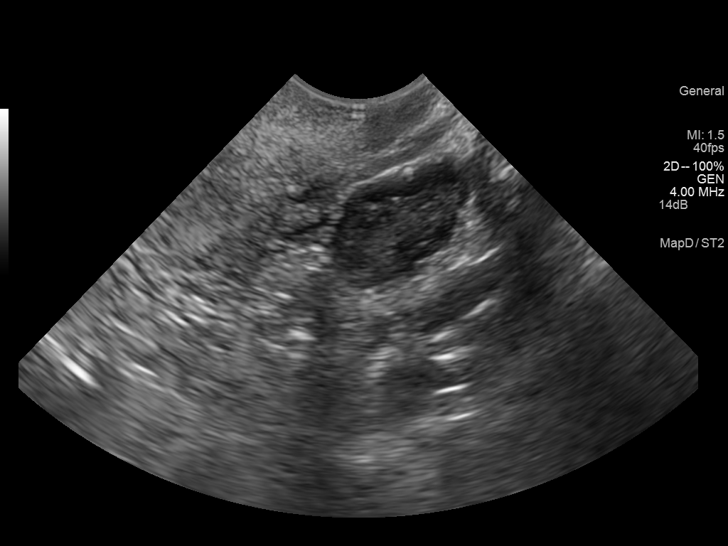
[im 84/84]
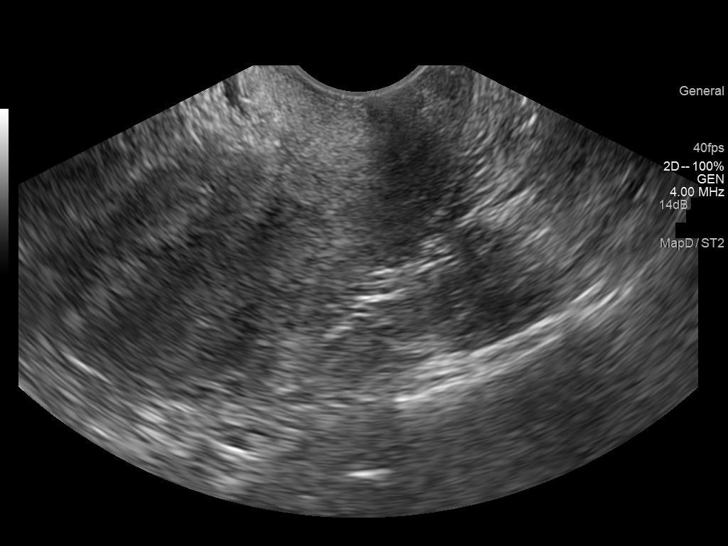

[13 of 25 positions shown; findings below may reference images not displayed]

FINDINGS: Uterus

Measurements: 8.3 x 3.7 x 4.7 cm. Somewhat geographic appearing area
of heterogeneously decreased echogenicity at the cervix best seen on
image 31, up to 10 mm. No vascularity evident with Dopple..
Otherwise no uterine mass identified.

Endometrium

Thickness: 7 mm.  No focal abnormality visualized.

Right ovary

Measurements: 2.7 x 2.1 x 2.3 cm. Several small follicles. Normal
appearance/no adnexal mass.

Left ovary

Measurements: 3.4 x 1.8 x 1.8 cm. Multiple small follicles. Normal
appearance/no adnexal mass.

Other findings

No free fluid.
IMPRESSION: 1. Abnormal echogenicity at the cervix, but without definite mass
effect or vascularity. Query previous Cervical Conization or Partial
Cervicectomy. Correlation with pelvic exam recommended.
2. Otherwise negative pelvis ultrasound.

## 2017-01-30 IMAGING — DX DG CHEST 2V
2 series · 2 of 2 positions shown · non-contrast
Comparison: None.

CLINICAL DATA: Acute onset of generalized body aches, ear pain and
cough. Initial encounter.

EXAM:
CHEST  2 VIEW

[chest pa]
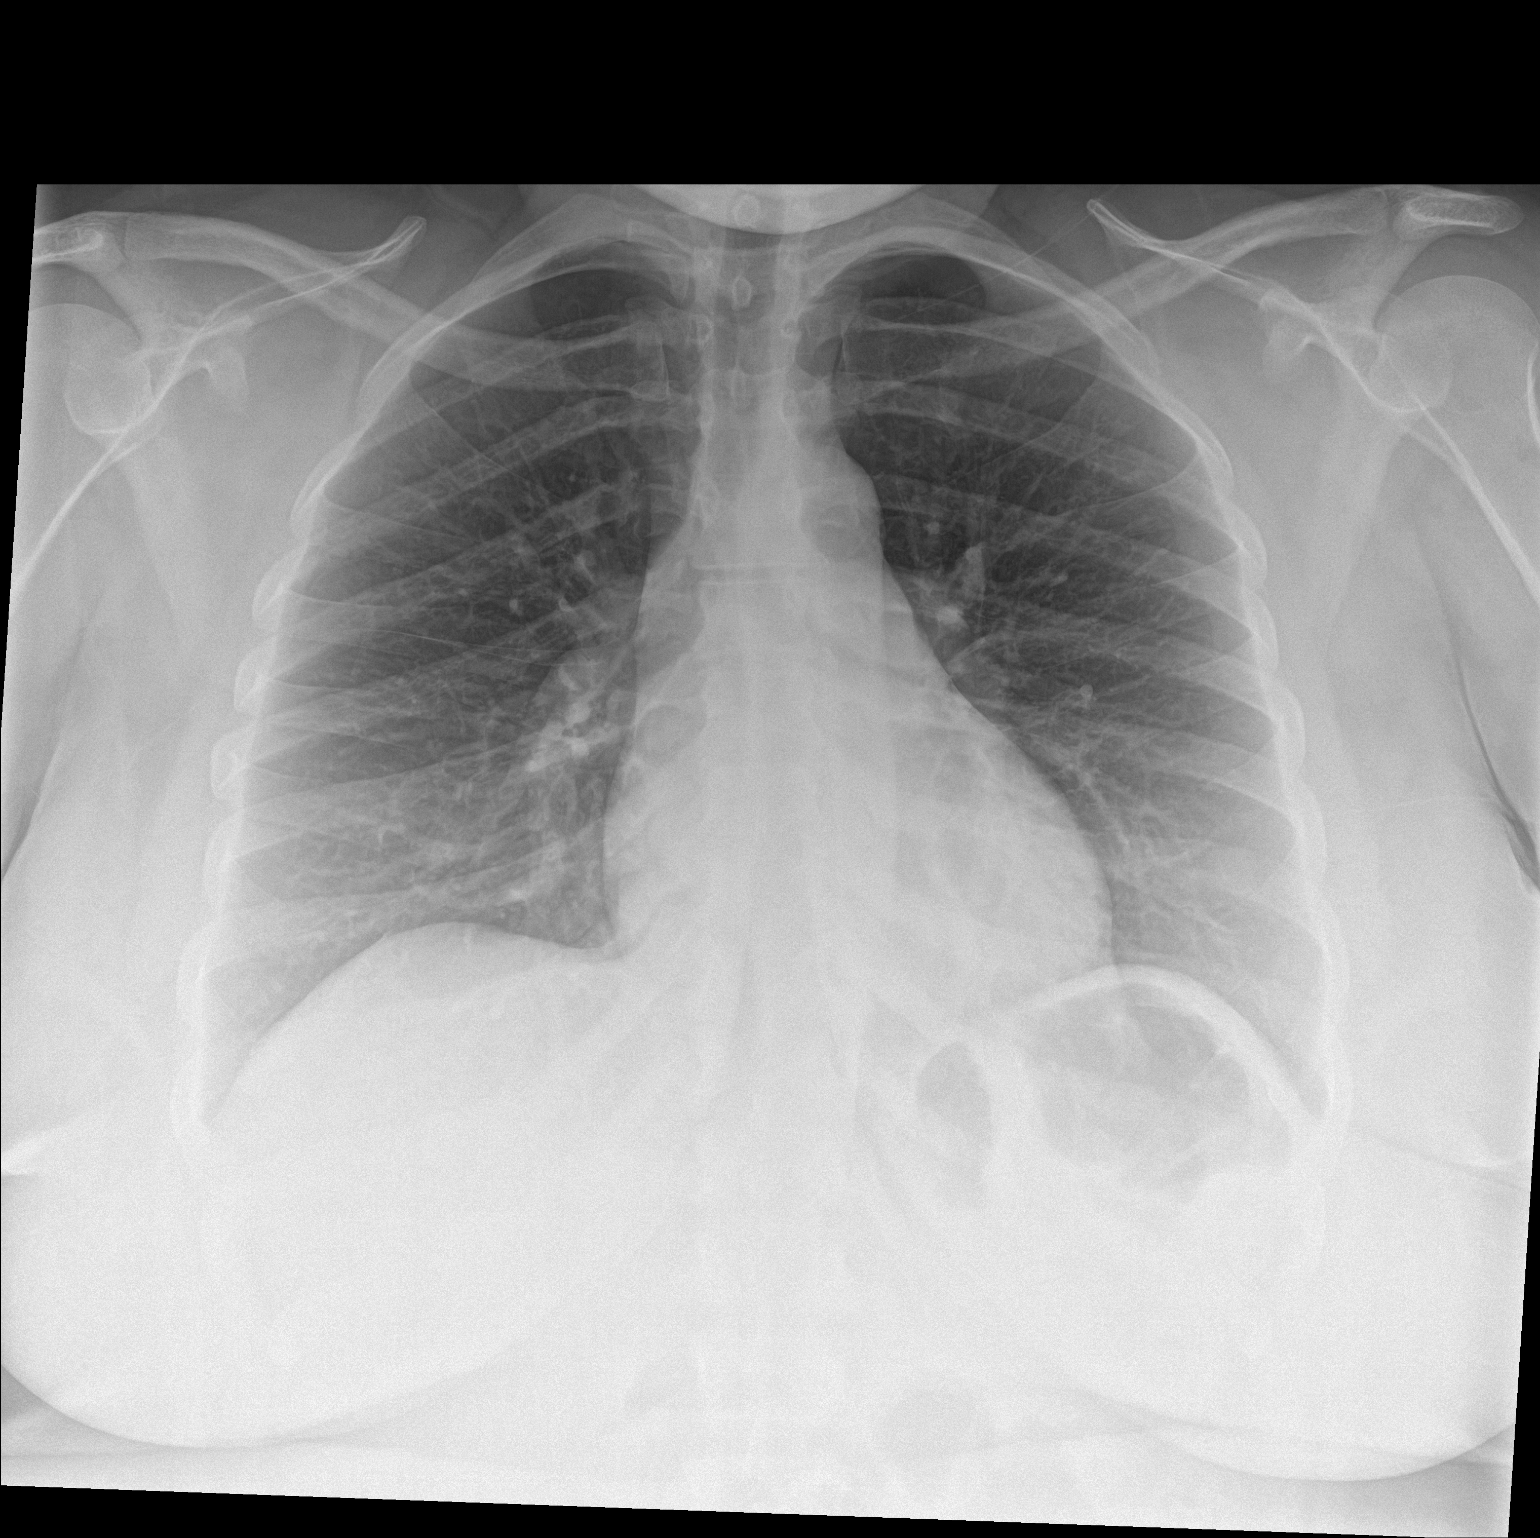

[chest lat]
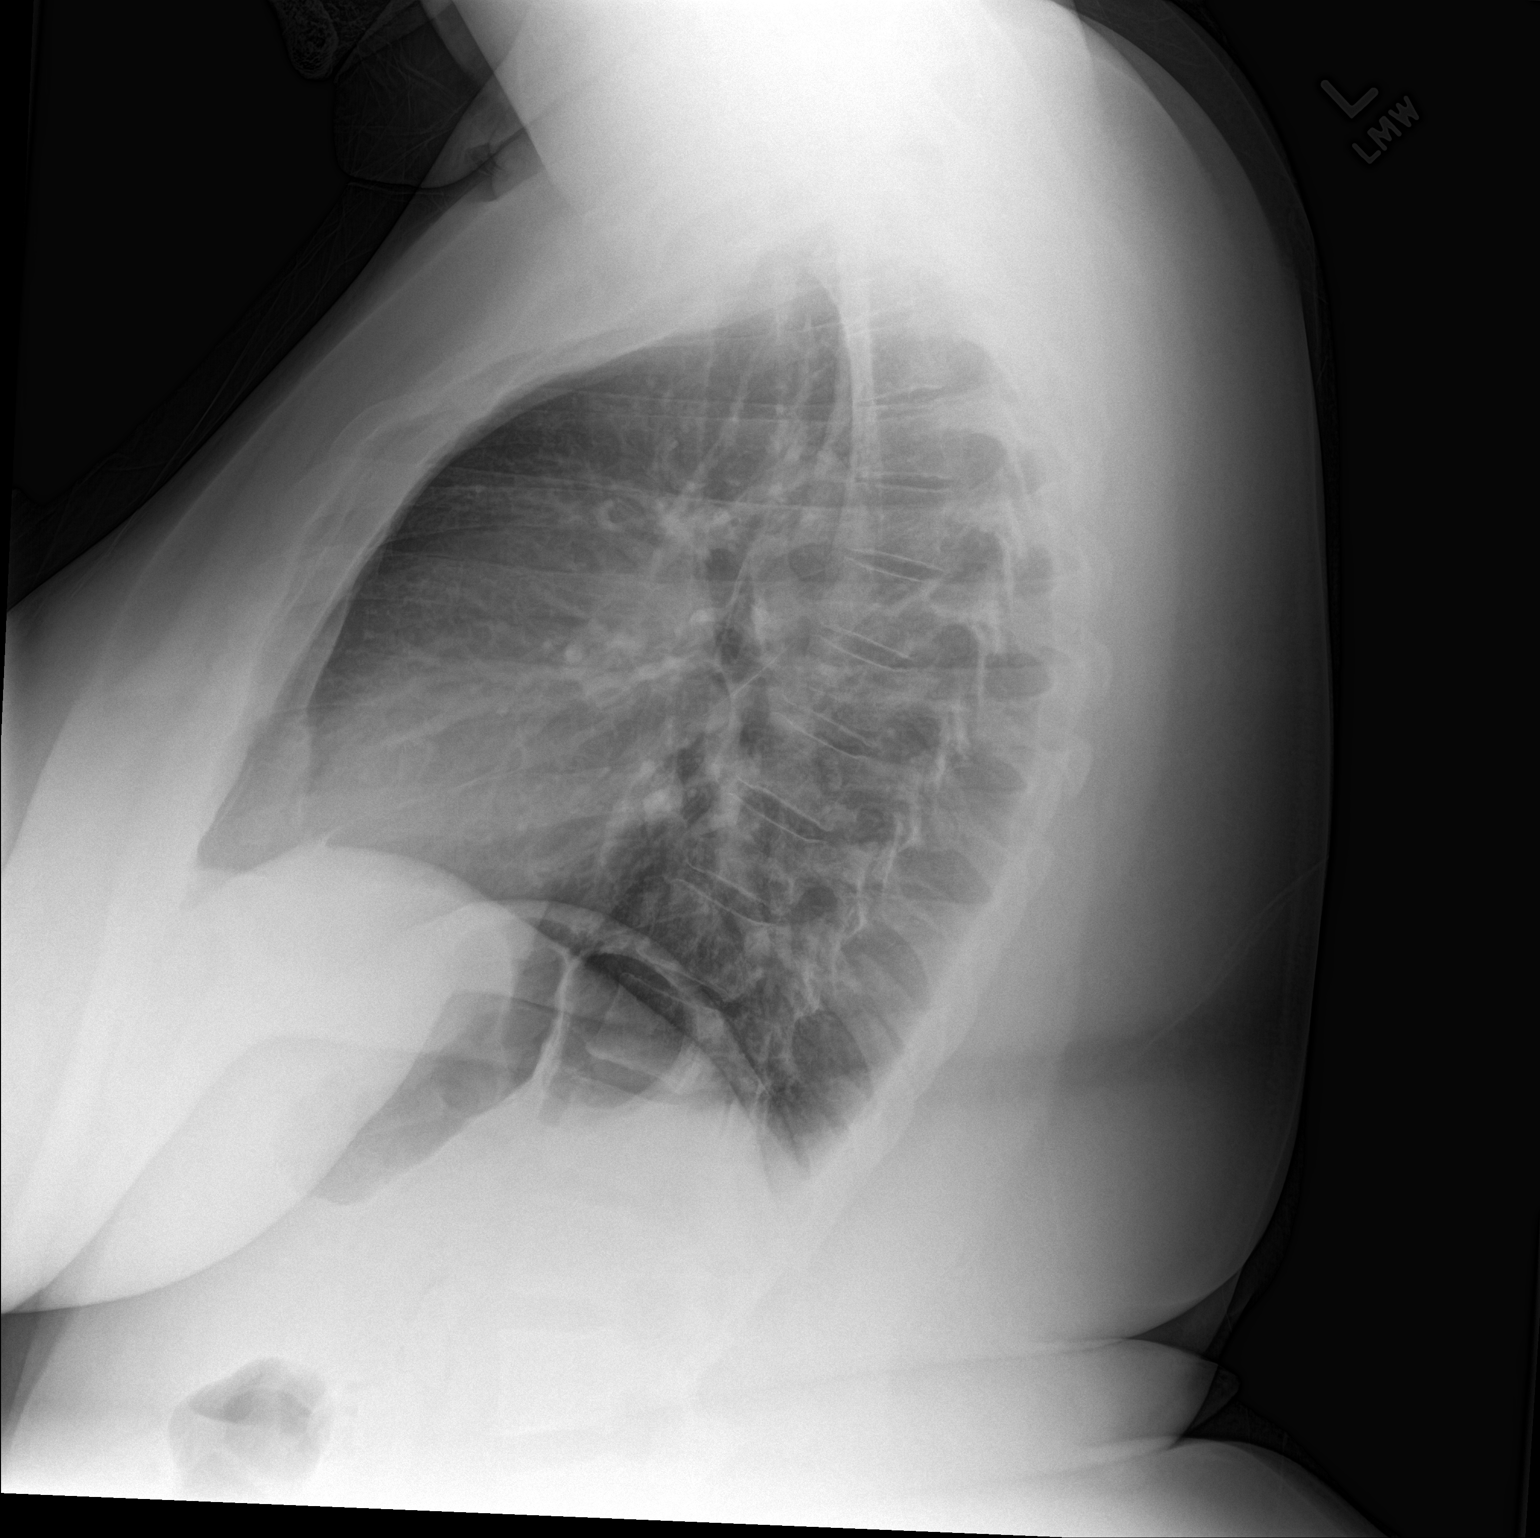

[2 of 2 positions shown; findings below may reference images not displayed]

FINDINGS: The lungs are well-aerated and clear. There is no evidence of focal
opacification, pleural effusion or pneumothorax.

The heart is normal in size; the mediastinal contour is within
normal limits. No acute osseous abnormalities are seen.
IMPRESSION: No acute cardiopulmonary process seen.

## 2017-06-13 IMAGING — US US PELVIS COMPLETE
1 series · 14 of 25 positions shown · non-contrast
Comparison: 06/11/2014.

CLINICAL DATA: Pelvic pain.  Abnormal menstrual cycle.



[Series 1: us pelvis complete · 0.25mm/px · 55 acquisitions, 14 frames shown]
[im 1/55]
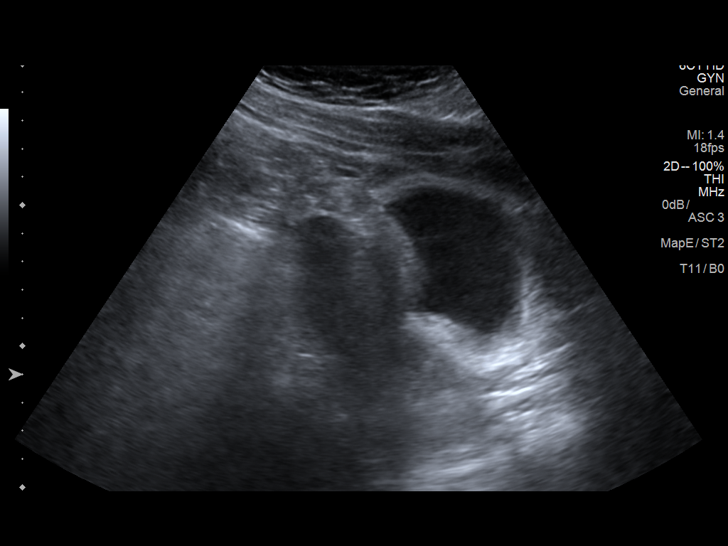
[im 5/55]
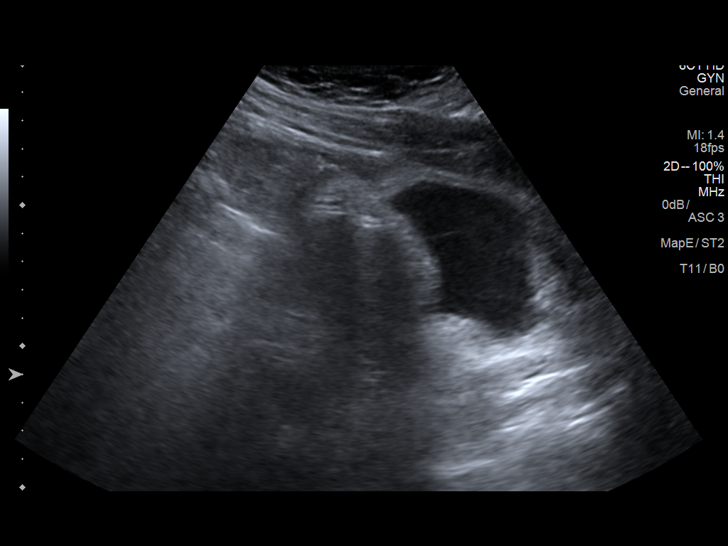
[im 10/55]
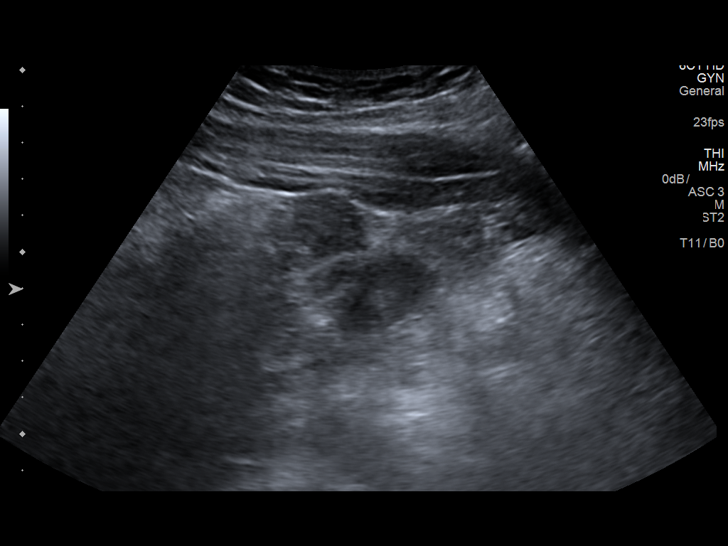
[im 14/55]
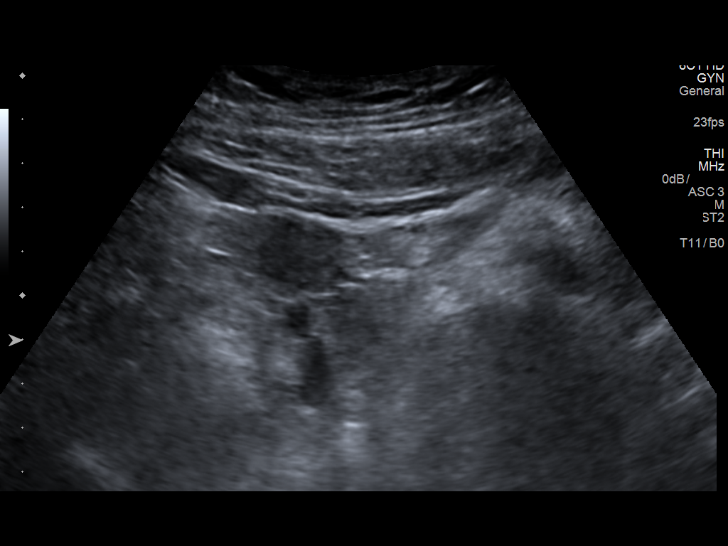
[im 19/55]
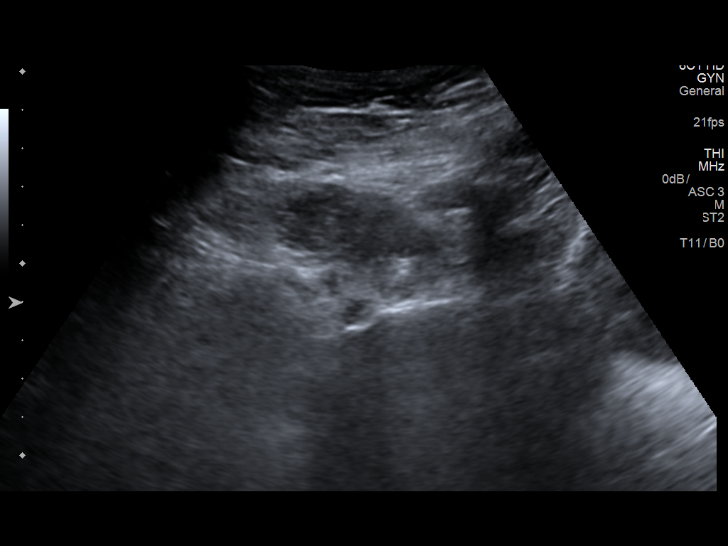
[im 21/55]
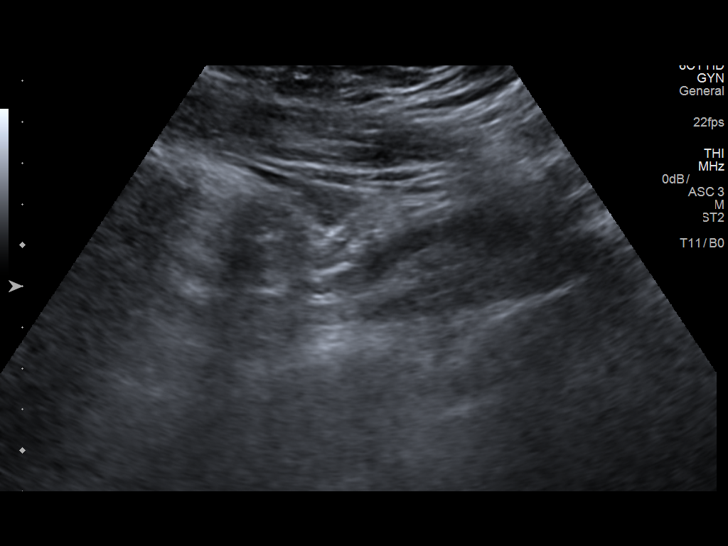
[im 25/55]
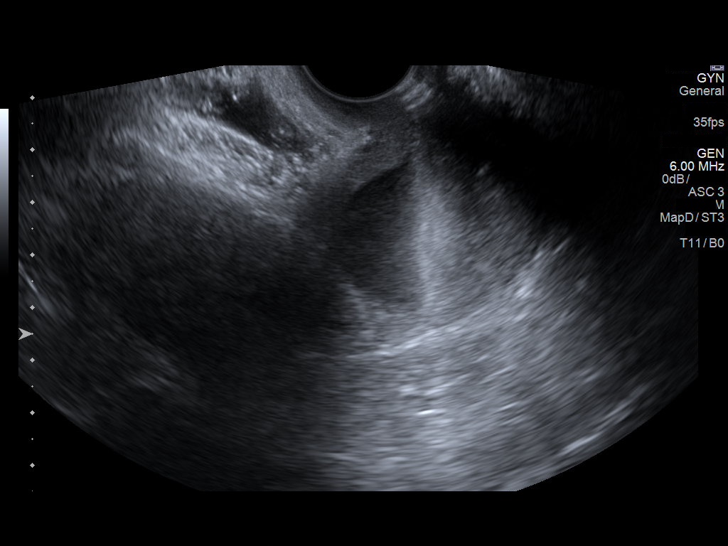
[im 30/55]
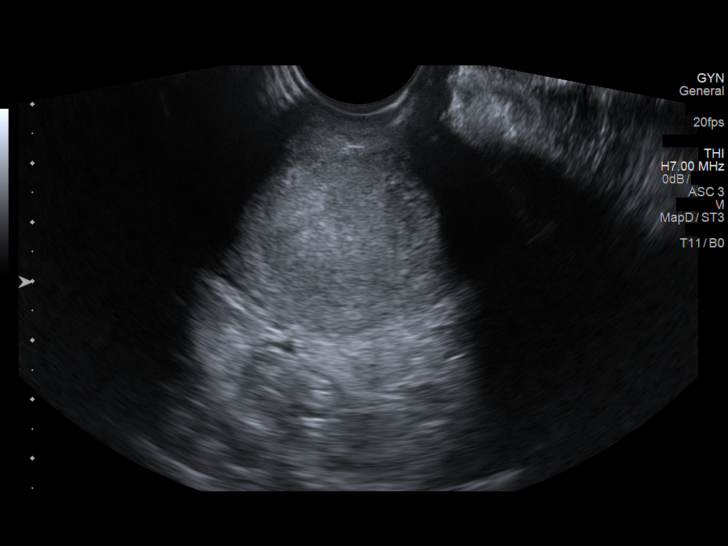
[im 34/55]
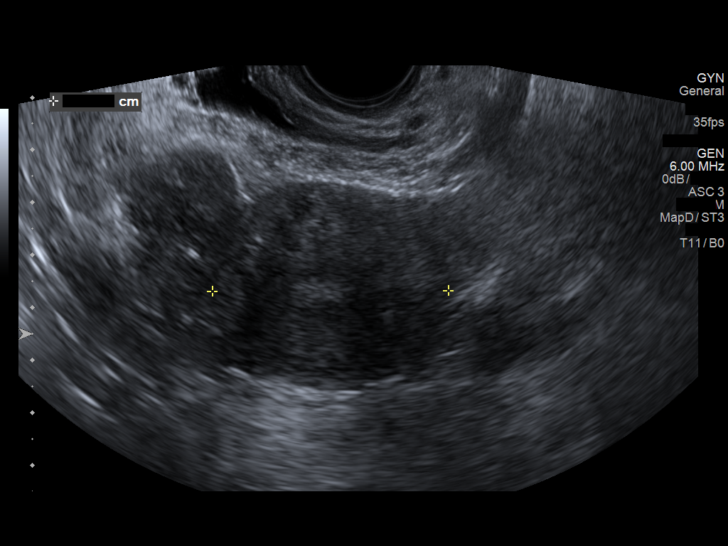
[im 37/55]
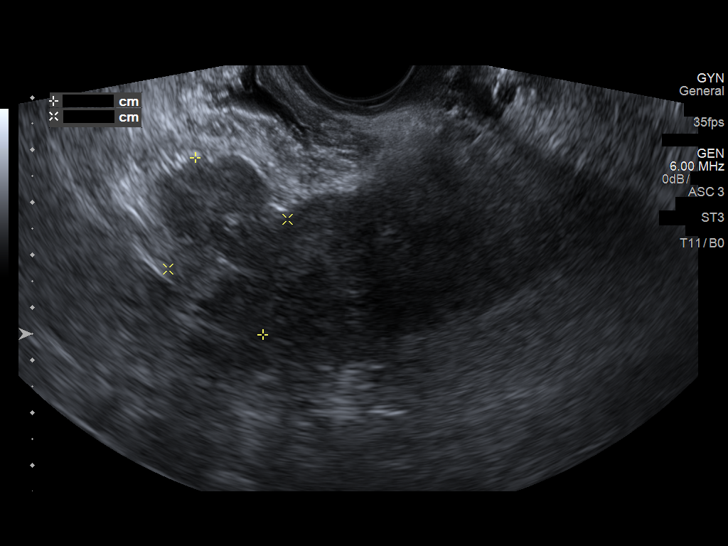
[im 41/55]
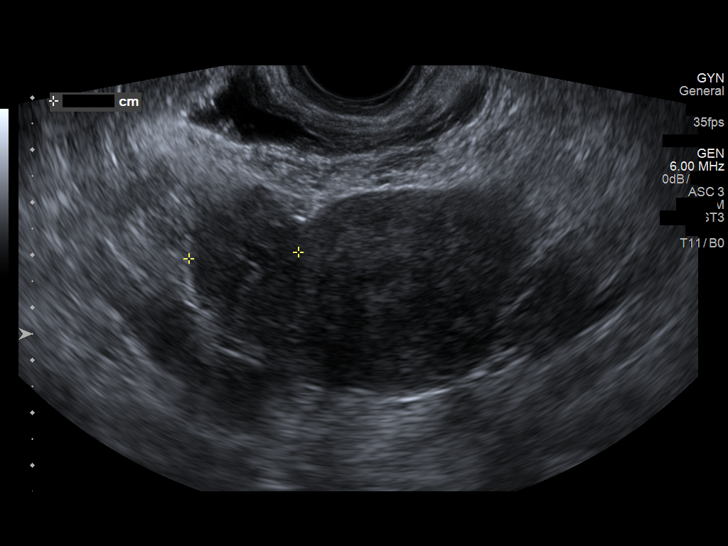
[im 46/55]
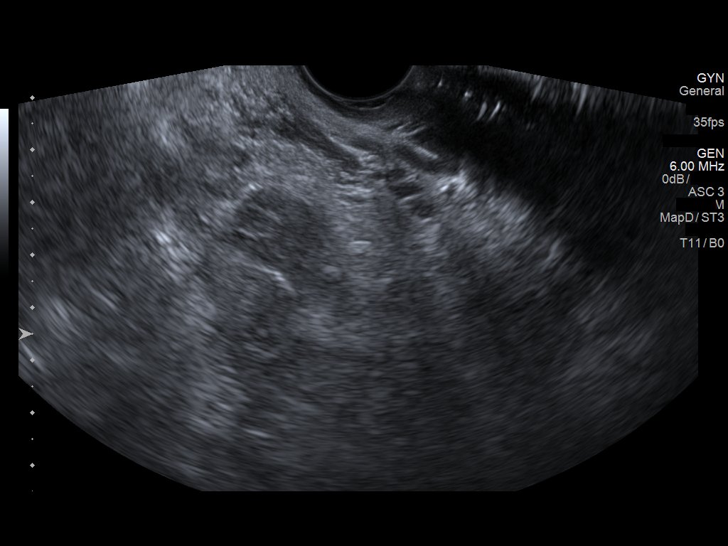
[im 50/55]
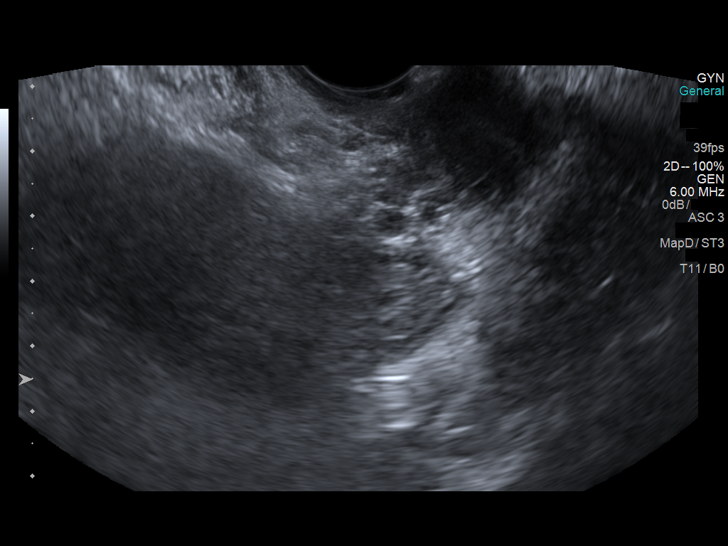
[im 55/55]
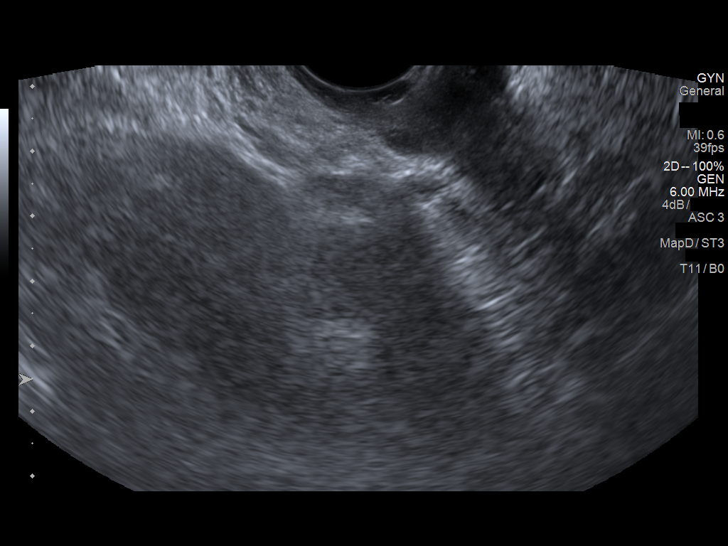

[14 of 25 positions shown; findings below may reference images not displayed]

FINDINGS: Uterus

Measurements: 9.9 x 3.8 x 4.5 cm. 4.1 x 3.5 x 3.9 cm heterogeneous
cervical mass with solid and cystic components. Cervical cancer
could present in this fashion. Direct visualization is suggested.

Endometrium

Thickness: 5.1 mm.  No focal abnormality visualized.

Right ovary

Measurements: 3.6 x 2.5 x 2.1 cm. Normal appearance/no adnexal mass.

Left ovary

Measurements: 2.7 x 1.8 x 1.5 cm. Normal appearance/no adnexal mass.

Other findings

No abnormal free fluid.
IMPRESSION: 4.1 x 3.5 x 3.9 complex cervical mass with cystic and solid
component. Cervical cancer could present in this fashion. Direct
visualization suggested.

## 2017-10-07 ENCOUNTER — Other Ambulatory Visit: Payer: Self-pay

## 2017-10-07 ENCOUNTER — Emergency Department (HOSPITAL_COMMUNITY)
Admission: EM | Admit: 2017-10-07 | Discharge: 2017-10-07 | Disposition: A | Discharge status: Home / Self Care | Payer: BLUE CROSS/BLUE SHIELD | Attending: Emergency Medicine | Admitting: Emergency Medicine

## 2017-10-07 DIAGNOSIS — M791 Myalgia, unspecified site: Secondary | ICD-10-CM | POA: Insufficient documentation

## 2017-10-07 DIAGNOSIS — R6883 Chills (without fever): Secondary | ICD-10-CM | POA: Diagnosis not present

## 2017-10-07 DIAGNOSIS — N390 Urinary tract infection, site not specified: Secondary | ICD-10-CM | POA: Diagnosis not present

## 2017-10-07 DIAGNOSIS — A5901 Trichomonal vulvovaginitis: Secondary | ICD-10-CM

## 2017-10-07 DIAGNOSIS — R0981 Nasal congestion: Secondary | ICD-10-CM | POA: Diagnosis not present

## 2017-10-07 DIAGNOSIS — B9689 Other specified bacterial agents as the cause of diseases classified elsewhere: Secondary | ICD-10-CM | POA: Diagnosis not present

## 2017-10-07 DIAGNOSIS — R102 Pelvic and perineal pain: Secondary | ICD-10-CM | POA: Diagnosis not present

## 2017-10-07 DIAGNOSIS — R05 Cough: Secondary | ICD-10-CM | POA: Diagnosis not present

## 2017-10-07 DIAGNOSIS — J069 Acute upper respiratory infection, unspecified: Secondary | ICD-10-CM | POA: Insufficient documentation

## 2017-10-07 DIAGNOSIS — R109 Unspecified abdominal pain: Secondary | ICD-10-CM | POA: Diagnosis present

## 2017-10-07 DIAGNOSIS — N760 Acute vaginitis: Secondary | ICD-10-CM | POA: Diagnosis not present

## 2017-10-07 LAB — COMPREHENSIVE METABOLIC PANEL
ALBUMIN: 3.4 g/dL — AB (ref 3.5–5.0)
ALT: 10 U/L — ABNORMAL LOW (ref 14–54)
ANION GAP: 8 (ref 5–15)
AST: 14 U/L — ABNORMAL LOW (ref 15–41)
Alkaline Phosphatase: 64 U/L (ref 38–126)
BUN: 14 mg/dL (ref 6–20)
CHLORIDE: 106 mmol/L (ref 101–111)
CO2: 27 mmol/L (ref 22–32)
Calcium: 8.9 mg/dL (ref 8.9–10.3)
Creatinine, Ser: 0.65 mg/dL (ref 0.44–1.00)
GFR calc Af Amer: >60 mL/min (ref >60)
GFR calc non Af Amer: >60 mL/min (ref >60)
Glucose, Bld: 115 mg/dL — ABNORMAL HIGH (ref 65–99)
POTASSIUM: 4 mmol/L (ref 3.5–5.1)
Sodium: 141 mmol/L (ref 135–145)
Total Bilirubin: 0.5 mg/dL (ref 0.3–1.2)
Total Protein: 7.6 g/dL (ref 6.5–8.1)

## 2017-10-07 LAB — CBC
HEMATOCRIT: 37.6 % (ref 36.0–46.0)
HEMOGLOBIN: 11.8 g/dL — AB (ref 12.0–15.0)
MCH: 28.4 pg (ref 26.0–34.0)
MCHC: 31.4 g/dL (ref 30.0–36.0)
MCV: 90.6 fL (ref 78.0–100.0)
Platelets: 267 10*3/uL (ref 150–400)
RBC: 4.15 MIL/uL (ref 3.87–5.11)
RDW: 14.3 % (ref 11.5–15.5)
WBC: 7.2 10*3/uL (ref 4.0–10.5)

## 2017-10-07 LAB — URINALYSIS, ROUTINE W REFLEX MICROSCOPIC
BILIRUBIN URINE: NEGATIVE
Glucose, UA: NEGATIVE mg/dL
KETONES UR: NEGATIVE mg/dL
NITRITE: NEGATIVE
PROTEIN: NEGATIVE mg/dL
SPECIFIC GRAVITY, URINE: 1.023 (ref 1.005–1.030)
pH: 5 (ref 5.0–8.0)

## 2017-10-07 LAB — WET PREP, GENITAL
CLUE CELLS WET PREP: POSITIVE — AB
Sperm: NONE SEEN
Trich, Wet Prep: POSITIVE — AB
Yeast Wet Prep HPF POC: NONE SEEN

## 2017-10-07 LAB — I-STAT BETA HCG BLOOD, ED (MC, WL, AP ONLY)

## 2017-10-07 LAB — LIPASE, BLOOD: LIPASE: 24 U/L (ref 11–51)

## 2017-10-07 MED ORDER — LIDOCAINE HCL 1 % IJ SOLN
INTRAMUSCULAR | Status: AC
  Administered 2017-10-07: 20 mL
  Filled 2017-10-07: qty 20

## 2017-10-07 MED ORDER — AZITHROMYCIN 250 MG PO TABS
1000.0000 mg | ORAL_TABLET | Freq: Once | ORAL | Status: AC
  Administered 2017-10-07: 1000 mg via ORAL
  Filled 2017-10-07: qty 4

## 2017-10-07 MED ORDER — CEFTRIAXONE SODIUM 250 MG IJ SOLR
250.0000 mg | Freq: Once | INTRAMUSCULAR | Status: AC
  Administered 2017-10-07: 250 mg via INTRAMUSCULAR
  Filled 2017-10-07: qty 250

## 2017-10-07 MED ORDER — NITROFURANTOIN MONOHYD MACRO 100 MG PO CAPS: 100.0000 mg | ORAL_CAPSULE | Freq: Two times a day (BID) | ORAL | 0 refills | Status: DC

## 2017-10-07 MED ORDER — METRONIDAZOLE 500 MG PO TABS: 500.0000 mg | ORAL_TABLET | Freq: Two times a day (BID) | ORAL | 0 refills | Status: DC

## 2017-10-07 NOTE — ED Triage Notes (Signed)
She c/o lower abd./pelvic area pain plus mild uri sx x 2 days. She is in no distress.

## 2017-10-07 NOTE — ED Provider Notes (Signed)
[facility]-EMERGENCY DEPT Provider Note   CSN: [id number] Arrival date & time: 10/07/17  4098     History   Chief Complaint Chief Complaint  Patient presents with  . Abdominal Pain    HPI Beth Carrillo is a 32 y.o. female.  HPI Beth Carrillo is a 32 y.o. female presents to emergency department complaining of upper respiratory symptoms and pelvic pain.  Patient states she has had nasal congestion, mild cough, body aches and chills for the last 3 days.  She denies any fever.  Denies any sore throat.  She states that her daughter sick with similar symptoms at this time as well.  She has not taken any medications for her symptoms prior to coming in.  She is also complaining of pelvic pain for several days.  She states she has history of ovarian cysts.  She states that the pain is in the midline and radiates to bilateral sides.  Denies any vaginal discharge or bleeding.  Denies any urinary symptoms.  She states sometimes when she sits down for a prolonged period of time her legs go numb and she is worried that could be related.  No nausea or vomiting.  No back pain.  No other complaints.  Past Medical History:  Diagnosis Date  . Obesity     There are no active problems to display for this patient.   Past Surgical History:  Procedure Laterality Date  . CESAREAN SECTION       OB History    Gravida  2   Para  1   Term  1   Preterm  0   AB  1   Living  1     SAB  1   TAB  0   Ectopic  0   Multiple  0   Live Births               Home Medications    Prior to Admission medications   Medication Sig Start Date End Date Taking? Authorizing Provider  ibuprofen (ADVIL,MOTRIN) 800 MG tablet Take 1 tablet (800 mg total) by mouth every 8 (eight) hours as needed. 09/16/15   Lawyer, Cristal Deer, PA-C  Prenatal Vit-Fe Fumarate-FA (PRENATAL MULTIVITAMIN) TABS tablet Take 1 tablet by mouth daily at 12 noon.    [provider]    traMADol (ULTRAM) 50 MG tablet Take 1 tablet (50 mg total) by mouth every 6 (six) hours as needed for severe pain. 09/16/15   Lawyer, Cristal Deer, PA-C    Family History No family history on file.  Social History Social History   Tobacco Use  . Smoking status: Never Smoker  Substance Use Topics  . Alcohol use: Yes    Comment: occasional  . Drug use: No     Allergies   Amoxicillin; Morphine and related; Penicillins; Pineapple; and Tomato   Review of Systems Review of Systems  Constitutional: Positive for chills. Negative for fever.  HENT: Positive for congestion. Negative for sore throat.   Respiratory: Positive for cough. Negative for chest tightness and shortness of breath.   Cardiovascular: Negative for chest pain, palpitations and leg swelling.  Gastrointestinal: Positive for abdominal pain. Negative for diarrhea, nausea and vomiting.  Genitourinary: Positive for pelvic pain. Negative for dysuria, flank pain, vaginal bleeding, vaginal discharge and vaginal pain.  Musculoskeletal: Negative for arthralgias, myalgias, neck pain and neck stiffness.  Skin: Negative for rash.  Neurological: Negative for dizziness, weakness and headaches.  All other systems reviewed and  are negative.    Physical Exam Updated Vital Signs BP (!) 134/95 (BP Location: Left Arm)   Pulse 84   Temp 97.8 F (36.6 C) (Oral)   Resp 18   LMP  (LMP Unknown)   SpO2 97%   Physical Exam  Constitutional: She is oriented to person, place, and time. She appears well-developed and well-nourished. No distress.  HENT:  Head: Normocephalic.  Clear rhinorrhea.  Oropharynx is normal.  Uvula is midline.,  Ear canals, TMs normal bilaterally.  Eyes: Conjunctivae are normal.  Neck: Neck supple.  Cardiovascular: Normal rate, regular rhythm and normal heart sounds.  Pulmonary/Chest: Effort normal and breath sounds normal. No respiratory distress. She has no wheezes. She has no rales.  Abdominal: Soft. Bowel  sounds are normal. She exhibits no distension. There is tenderness. There is no rebound.  Suprapubic tenderness  Genitourinary:  Genitourinary Comments: Normal external genitalia. Normal vaginal canal. Small thin white discharge. Cervix is normal, closed. Positive CMT. No uterine or adnexal tenderness. No masses palpated.    Musculoskeletal: She exhibits no edema.  Neurological: She is alert and oriented to person, place, and time.  Skin: Skin is warm and dry.  Psychiatric: She has a normal mood and affect. Her behavior is normal.  Nursing note and vitals reviewed.    ED Treatments / Results  Labs (all labs ordered are listed, but only abnormal results are displayed) Labs Reviewed  WET PREP, GENITAL - Abnormal; Notable for the following components:      Result Value   Trich, Wet Prep POSITIVE (*)    Clue Cells Wet Prep HPF POC POSITIVE (*)    WBC, Wet Prep HPF POC FEW (*)    All other components within normal limits  COMPREHENSIVE METABOLIC PANEL - Abnormal; Notable for the following components:   Glucose, Bld 115 (*)    Albumin 3.4 (*)    AST 14 (*)    ALT 10 (*)    All other components within normal limits  CBC - Abnormal; Notable for the following components:   Hemoglobin 11.8 (*)    All other components within normal limits  URINALYSIS, ROUTINE W REFLEX MICROSCOPIC - Abnormal; Notable for the following components:   APPearance HAZY (*)    Hgb urine dipstick SMALL (*)    Leukocytes, UA LARGE (*)    Bacteria, UA MANY (*)    All other components within normal limits  LIPASE, BLOOD  I-STAT BETA HCG BLOOD, ED ([facility], [facility], [facility] ONLY)  GC/CHLAMYDIA PROBE AMP ([place]) NOT AT [facility]    EKG None  Radiology No results found.  Procedures Procedures (including critical care time)  Medications Ordered in ED Medications - No data to display   Initial Impression / Assessment and Plan / ED Course  I have reviewed the triage vital signs and the nursing notes.  Pertinent labs  & imaging results that were available during my care of the patient were reviewed by me and considered in my medical decision making (see chart for details).     Patient in emergency department with upper respiratory symptoms.  Her exam is consistent with viral upper respiratory infection.  Do not think she needs any imaging or testing at this time for further evaluation of this complaint.  She is also complaining of pelvic pain.  Will check pregnancy test, urinalysis, perform pelvic exam.   Patient pelvic exam have cervical motion tenderness, concerning for cervicitis.  Wet read shows trichomonas, clue cells, white blood cells.  I  will start her on Flagyl, also will cover with Rocephin and Zithromax for possible gonorrhea and Chlamydia infections.  Cultures have sent.  Patient's urine is contaminated, however also concerning for infection.  I will start her Macrobid.  I do not think she needs any further imaging at this time.  Her vital signs are normal.  She is nontoxic-appearing.  Plan to discharge home with close outpatient follow-up.  Vitals:   10/07/17 0952 10/07/17 1242  BP: (!) 134/95 121/86  Pulse: 84 75  Resp: 18 18  Temp: 97.8 F (36.6 C) 98.1 F (36.7 C)  TempSrc: Oral Oral  SpO2: 97% 100%    Final Clinical Impressions(s) / ED Diagnoses   Final diagnoses:  Lower urinary tract infectious disease  Pelvic pain in female  Trichomonal vaginitis  Bacterial vaginosis  Upper respiratory tract infection, unspecified type    ED Discharge Orders        Ordered    metroNIDAZOLE (FLAGYL) 500 MG tablet  2 times daily     10/07/17 1316    nitrofurantoin, macrocrystal-monohydrate, (MACROBID) 100 MG capsule  2 times daily     10/07/17 1316       Jaynie Crumble, PA-C 10/07/17 1319    Doug Sou, MD 10/07/17 [phone]

## 2017-10-07 NOTE — Discharge Instructions (Addendum)
Take flagyl as prescribed until all gone. Take macrobid for UTI. Ibuprofen or tylenol for pain. Follow up with family doctor next week as needed.

## 2017-10-10 LAB — GC/CHLAMYDIA PROBE AMP (~~LOC~~) NOT AT ARMC
CHLAMYDIA, DNA PROBE: NEGATIVE
NEISSERIA GONORRHEA: NEGATIVE

## 2017-11-16 ENCOUNTER — Encounter (HOSPITAL_COMMUNITY): Payer: Self-pay | Admitting: Emergency Medicine

## 2017-11-16 ENCOUNTER — Other Ambulatory Visit: Payer: Self-pay

## 2017-11-16 ENCOUNTER — Emergency Department (HOSPITAL_COMMUNITY): Payer: BLUE CROSS/BLUE SHIELD

## 2017-11-16 ENCOUNTER — Emergency Department (HOSPITAL_COMMUNITY)
Admission: EM | Admit: 2017-11-16 | Discharge: 2017-11-16 | Disposition: A | Discharge status: Home / Self Care | Payer: BLUE CROSS/BLUE SHIELD | Attending: Emergency Medicine | Admitting: Emergency Medicine

## 2017-11-16 DIAGNOSIS — R1010 Upper abdominal pain, unspecified: Secondary | ICD-10-CM

## 2017-11-16 DIAGNOSIS — J029 Acute pharyngitis, unspecified: Secondary | ICD-10-CM | POA: Diagnosis present

## 2017-11-16 DIAGNOSIS — R112 Nausea with vomiting, unspecified: Secondary | ICD-10-CM | POA: Diagnosis not present

## 2017-11-16 DIAGNOSIS — J069 Acute upper respiratory infection, unspecified: Secondary | ICD-10-CM | POA: Insufficient documentation

## 2017-11-16 DIAGNOSIS — Z79899 Other long term (current) drug therapy: Secondary | ICD-10-CM | POA: Insufficient documentation

## 2017-11-16 DIAGNOSIS — B9789 Other viral agents as the cause of diseases classified elsewhere: Secondary | ICD-10-CM

## 2017-11-16 DIAGNOSIS — R197 Diarrhea, unspecified: Secondary | ICD-10-CM | POA: Diagnosis not present

## 2017-11-16 LAB — CBC WITH DIFFERENTIAL/PLATELET
Basophils Absolute: 0 10*3/uL (ref 0.0–0.1)
Basophils Relative: 0 %
Eosinophils Absolute: 0.3 10*3/uL (ref 0.0–0.7)
Eosinophils Relative: 4 %
HCT: 36.5 % (ref 36.0–46.0)
Hemoglobin: 11.6 g/dL — ABNORMAL LOW (ref 12.0–15.0)
Lymphocytes Relative: 34 %
Lymphs Abs: 2.6 10*3/uL (ref 0.7–4.0)
MCH: 28.6 pg (ref 26.0–34.0)
MCHC: 31.8 g/dL (ref 30.0–36.0)
MCV: 89.9 fL (ref 78.0–100.0)
Monocytes Absolute: 0.7 10*3/uL (ref 0.1–1.0)
Monocytes Relative: 10 %
Neutro Abs: 3.9 10*3/uL (ref 1.7–7.7)
Neutrophils Relative %: 52 %
Platelets: 295 10*3/uL (ref 150–400)
RBC: 4.06 MIL/uL (ref 3.87–5.11)
RDW: 14.7 % (ref 11.5–15.5)
WBC: 7.6 10*3/uL (ref 4.0–10.5)

## 2017-11-16 LAB — LIPASE, BLOOD: Lipase: 21 U/L (ref 11–51)

## 2017-11-16 LAB — URINALYSIS, ROUTINE W REFLEX MICROSCOPIC
Bilirubin Urine: NEGATIVE
Glucose, UA: NEGATIVE mg/dL
Hgb urine dipstick: NEGATIVE
Ketones, ur: NEGATIVE mg/dL
Nitrite: NEGATIVE
Protein, ur: NEGATIVE mg/dL
Specific Gravity, Urine: 1.01 (ref 1.005–1.030)
pH: 7 (ref 5.0–8.0)

## 2017-11-16 LAB — COMPREHENSIVE METABOLIC PANEL
ALT: 12 U/L (ref 0–44)
AST: 13 U/L — ABNORMAL LOW (ref 15–41)
Albumin: 3.4 g/dL — ABNORMAL LOW (ref 3.5–5.0)
Alkaline Phosphatase: 55 U/L (ref 38–126)
Anion gap: 7 (ref 5–15)
BUN: 11 mg/dL (ref 6–20)
CO2: 27 mmol/L (ref 22–32)
Calcium: 8.7 mg/dL — ABNORMAL LOW (ref 8.9–10.3)
Chloride: 107 mmol/L (ref 98–111)
Creatinine, Ser: 0.71 mg/dL (ref 0.44–1.00)
GFR calc Af Amer: >60 mL/min (ref >60)
GFR calc non Af Amer: >60 mL/min (ref >60)
Glucose, Bld: 94 mg/dL (ref 70–99)
Potassium: 4 mmol/L (ref 3.5–5.1)
Sodium: 141 mmol/L (ref 135–145)
Total Bilirubin: 0.3 mg/dL (ref 0.3–1.2)
Total Protein: 7 g/dL (ref 6.5–8.1)

## 2017-11-16 LAB — I-STAT BETA HCG BLOOD, ED (MC, WL, AP ONLY): I-stat hCG, quantitative: <5 m[IU]/mL (ref <5)

## 2017-11-16 LAB — GROUP A STREP BY PCR: GROUP A STREP BY PCR: NOT DETECTED

## 2017-11-16 LAB — URINALYSIS, MICROSCOPIC (REFLEX)

## 2017-11-16 MED ORDER — ONDANSETRON 4 MG PO TBDP
4.0000 mg | ORAL_TABLET | Freq: Once | ORAL | Status: AC
  Administered 2017-11-16: 4 mg via ORAL
  Filled 2017-11-16: qty 1

## 2017-11-16 MED ORDER — GUAIFENESIN ER 600 MG PO TB12: 600.0000 mg | ORAL_TABLET | Freq: Two times a day (BID) | ORAL | 0 refills | Status: DC | PRN

## 2017-11-16 MED ORDER — FLUTICASONE PROPIONATE 50 MCG/ACT NA SUSP
2.0000 | Freq: Every day | NASAL | 0 refills | Status: DC
Start: 2017-11-16 — End: 2019-06-18

## 2017-11-16 MED ORDER — ONDANSETRON 4 MG PO TBDP: 4.0000 mg | ORAL_TABLET | Freq: Three times a day (TID) | ORAL | 0 refills | Status: AC | PRN

## 2017-11-16 NOTE — Discharge Instructions (Signed)
Use Flonase and Mucinex for nasal congestion.  Can also take over-the-counter medications such as allergy medicine, DayQuil, and Chloraseptic Spray for sore throat and nasal congestion/cough.  Take Zofran as needed for nausea and vomiting.  Wait around 20 minutes before having anything to eat or drink to give the medication time to work.  Eat a diet of bland foods that will not upset your stomach such as saltine crackers, broth, mashed potatoes, and plain chicken.  You can take ibuprofen or Tylenol as needed for pain.  Follow-up with your primary care physician if symptoms persist.  Return to the emergency department if any concerning signs or symptoms develop such as high fevers, persistent vomiting, worsening pain, difficulty swallowing/drooling.

## 2017-11-16 NOTE — ED Provider Notes (Signed)
[facility]-EMERGENCY DEPT Provider Note   CSN: [id number] Arrival date & time: 11/16/17  [id number]     History   Chief Complaint Chief Complaint  Patient presents with  . Sore Throat  . Abdominal Pain  . Nausea    HPI Beth Carrillo is a 32 y.o. female with history of obesity presents for evaluation of acute onset, progressively worsening sore throat and nasal congestion for 5 days.  She states that 2 days ago her sore throat developed.  She notes generalized body aches, cough productive of yellow sputum, and aching anterior chest wall pain with cough only.  Denies shortness of breath, fevers, or chills.  She denies facial swelling or drooling but notes that she has purposely been speaking with a soft voice due to her cough and scratchy throat.  States that she can speak in her normal voice but does not want to due to symptoms. She also notes that for the past several days she has had intermittent stabbing/cramping pain of the upper abdomen.  She states that she has had 3 episodes of nonbloody nonbilious emesis and a few episodes of watery nonbloody diarrhea that has since resolved.  She thinks that her nausea, vomiting, diarrhea may have been secondary to chicken wings that she ate prior to symptom onset.  Denies urinary symptoms, vaginal itching, bleeding, discharge, melena, hematochezia, or hematuria.  She has tried orange juice and warm tea without relief of her symptoms.  The history is provided by the patient.    Past Medical History:  Diagnosis Date  . Obesity     There are no active problems to display for this patient.   Past Surgical History:  Procedure Laterality Date  . CESAREAN SECTION       OB History    Gravida  2   Para  1   Term  1   Preterm  0   AB  1   Living  1     SAB  1   TAB  0   Ectopic  0   Multiple  0   Live Births               Home Medications    Prior to Admission medications   Medication Sig Start  Date End Date Taking? Authorizing Provider  fluticasone (FLONASE) 50 MCG/ACT nasal spray Place 2 sprays into both nostrils daily. 11/16/17   Olive Motyka A, PA-C  guaiFENesin (MUCINEX) 600 MG 12 hr tablet Take 1 tablet (600 mg total) by mouth 2 (two) times daily as needed for cough or to loosen phlegm. 11/16/17   Quetzal Meany A, PA-C  ibuprofen (ADVIL,MOTRIN) 800 MG tablet Take 1 tablet (800 mg total) by mouth every 8 (eight) hours as needed. Patient not taking: Reported on 11/16/2017 09/16/15   Charlestine NightLawyer, Christopher, PA-C  metroNIDAZOLE (FLAGYL) 500 MG tablet Take 1 tablet (500 mg total) by mouth 2 (two) times daily. Patient not taking: Reported on 11/16/2017 10/07/17   Jaynie CrumbleKirichenko, Tatyana, PA-C  nitrofurantoin, macrocrystal-monohydrate, (MACROBID) 100 MG capsule Take 1 capsule (100 mg total) by mouth 2 (two) times daily. Patient not taking: Reported on 11/16/2017 10/07/17   Jaynie CrumbleKirichenko, Tatyana, PA-C  ondansetron (ZOFRAN ODT) 4 MG disintegrating tablet Take 1 tablet (4 mg total) by mouth every 8 (eight) hours as needed for up to 3 days for nausea or vomiting. 11/16/17 11/19/17  Michela PitcherFawze, Nikiyah Fackler A, PA-C  traMADol (ULTRAM) 50 MG tablet Take 1 tablet (50 mg total) by mouth every  6 (six) hours as needed for severe pain. Patient not taking: Reported on 11/16/2017 09/16/15   Charlestine Night, PA-C    Family History No family history on file.  Social History Social History   Tobacco Use  . Smoking status: Never Smoker  . Smokeless tobacco: Never Used  Substance Use Topics  . Alcohol use: Yes    Comment: occasional  . Drug use: No     Allergies   Amoxicillin; Morphine and related; Penicillins; Pineapple; and Tomato   Review of Systems Review of Systems  Constitutional: Negative for chills and fever.  HENT: Positive for congestion and sore throat. Negative for facial swelling and trouble swallowing.   Respiratory: Positive for cough. Negative for shortness of breath.   Cardiovascular: Positive for chest pain.    Gastrointestinal: Positive for abdominal pain, diarrhea, nausea and vomiting. Negative for blood in stool and constipation.  Genitourinary: Negative for dysuria, hematuria, vaginal bleeding, vaginal discharge and vaginal pain.  All other systems reviewed and are negative.    Physical Exam Updated Vital Signs BP (!) 135/98 (BP Location: Left Arm) Comment: RN aware  Pulse 73   Temp 98.5 F (36.9 C) (Oral)   Resp 16   Ht 5' (1.524 m)   Wt 124.7 kg (275 lb)   LMP 10/17/2017 Comment: pt was shielded  SpO2 97%   BMI 53.71 kg/m   Physical Exam  Constitutional: She appears well-developed and well-nourished. No distress.  HENT:  Head: Normocephalic and atraumatic.  Right Ear: A middle ear effusion is present.  Left Ear: A middle ear effusion is present.  Mouth/Throat: Uvula is midline and mucous membranes are normal. No uvula swelling. Posterior oropharyngeal erythema present. No posterior oropharyngeal edema or tonsillar abscesses. Tonsils are 1+ on the right. Tonsils are 1+ on the left.  No frontal or maxillary sinus tenderness.  Nasal septum midline with mild mucosal edema bilaterally.  Posterior oropharynx with mild tonsillar hypertrophy, no exudates, trismus, or sublingual abnormalities.  No uvular deviation.  Tolerating secretions without difficulty.  Speaking with a soft, slightly hoarse voice  Eyes: Pupils are equal, round, and reactive to light. Conjunctivae and EOM are normal. Right eye exhibits no discharge. Left eye exhibits no discharge.  Neck: Normal range of motion and full passive range of motion without pain. Neck supple. No JVD present. No tracheal deviation present.  Cardiovascular: Normal rate, regular rhythm, normal heart sounds and intact distal pulses.  Pulmonary/Chest: Effort normal and breath sounds normal. No respiratory distress. She has no wheezes. She has no rhonchi. She has no rales. She exhibits tenderness.  Equal rise and fall of chest, no increased work of  breathing, speaking in full sentences without difficulty.  She has diffuse tenderness to palpation of the anterior chest wall with no deformity, crepitus, ecchymosis, or segment noted.  Abdominal: Soft. Bowel sounds are normal. She exhibits no distension and no mass. There is tenderness. There is no rebound and no guarding.  Mild tenderness to palpation of the upper abdomen with no distention or peritoneal signs.  Murphy sign absent, Rovsing's absent, no McBurney's point tenderness.  No CVA tenderness bilaterally  Musculoskeletal: She exhibits no edema.  Lymphadenopathy:    She has no cervical adenopathy.  Neurological: She is alert.  Skin: Skin is warm and dry. No erythema.  Psychiatric: She has a normal mood and affect. Her behavior is normal.  Nursing note and vitals reviewed.    ED Treatments / Results  Labs (all labs ordered are listed, but only  abnormal results are displayed) Labs Reviewed  CBC WITH DIFFERENTIAL/PLATELET - Abnormal; Notable for the following components:      Result Value   Hemoglobin 11.6 (*)    All other components within normal limits  COMPREHENSIVE METABOLIC PANEL - Abnormal; Notable for the following components:   Calcium 8.7 (*)    Albumin 3.4 (*)    AST 13 (*)    All other components within normal limits  URINALYSIS, ROUTINE W REFLEX MICROSCOPIC - Abnormal; Notable for the following components:   APPearance HAZY (*)    Leukocytes, UA TRACE (*)    All other components within normal limits  URINALYSIS, MICROSCOPIC (REFLEX) - Abnormal; Notable for the following components:   Bacteria, UA FEW (*)    All other components within normal limits  GROUP A STREP BY PCR  URINE CULTURE  LIPASE, BLOOD  I-STAT BETA HCG BLOOD, ED ([facility], [facility], [facility] ONLY)    EKG None  Radiology Dg Chest 2 View  Result Date: 11/16/2017 CLINICAL DATA:  Cough and congestion over the last 2 days. EXAM: CHEST - 2 VIEW COMPARISON:  10/28/2014 FINDINGS: The heart size and mediastinal  contours are within normal limits. Both lungs are clear. The visualized skeletal structures are unremarkable. IMPRESSION: No active cardiopulmonary disease. Electronically Signed   By: Paulina Fusi M.D.   On: 11/16/2017 07:36    Procedures Procedures (including critical care time)  Medications Ordered in ED Medications  ondansetron (ZOFRAN-ODT) disintegrating tablet 4 mg (4 mg Oral Given 11/16/17 0750)     Initial Impression / Assessment and Plan / ED Course  I have reviewed the triage vital signs and the nursing notes.  Pertinent labs & imaging results that were available during my care of the patient were reviewed by me and considered in my medical decision making (see chart for details).     Patient with viral URI with cough with associated nausea, vomiting, diarrhea.  She is afebrile, vital signs are stable.  She is nontoxic in appearance.  She is tolerating secretions without difficulty.  Abdomen is soft, no peritoneal signs.  She has upper abdominal pain, no concern for PID, TOA, ovarian torsion, or other GU pathology.  Doubt obstruction, perforation, appendicitis, colitis, or other acute surgical abdominal pathology.  Lab work reviewed by me shows no leukocytosis, mild stable anemia, no electrolyte abnormalities.  LFTs, lipase, and creatinine are within normal limits.  UA shows trace leukocytes and few bacteria but not entirely concerning for UTI.  Patient has no urinary symptoms and no suprapubic tenderness or CVA tenderness on examination.  Will culture but hold off on treatment at this time.  No hematuria to suggest nephrolithiasis.  I do not see a need for emergent imaging of the abdomen at this time.  Chest x-ray shows no evidence of acute cardiopulmonary abnormality, no evidence of edema or or consolidation.  Chest pain is reproducible on palpation, does not appear to be cardiac in nature but rather musculoskeletal from persistent cough.  No fever or meningeal signs to suggest  meningitis.  She was given Zofran and was able to tolerate p.o. fluids in the ED without difficulty.  Will discharge with symptomatic management.  Instructions to advance diet slowly, stay well-hydrated.  Recommend follow-up with PCP if symptoms persist.  Discussed strict ED return precautions. Pt verbalized understanding of and agreement with plan and is safe for discharge home at this time.   Final Clinical Impressions(s) / ED Diagnoses   Final diagnoses:  Viral URI with cough  Nausea vomiting and diarrhea  Pain of upper abdomen    ED Discharge Orders        Ordered    fluticasone (FLONASE) 50 MCG/ACT nasal spray  Daily     11/16/17 0956    guaiFENesin (MUCINEX) 600 MG 12 hr tablet  2 times daily PRN     11/16/17 0956    ondansetron (ZOFRAN ODT) 4 MG disintegrating tablet  Every 8 hours PRN     11/16/17 0956       Jeanie Sewer, PA-C 11/16/17 1014    Vanetta Mulders, MD 11/17/17 [phone]

## 2017-11-16 NOTE — ED Triage Notes (Signed)
Pt presents unable to speak due to sore throat and nasal congestion. Patient also with a cough and body aches. Patient states this started on Saturday. Patient has productive cough with yellow sputum. No fevers.

## 2017-11-16 NOTE — ED Notes (Signed)
Pt given ginger ale. Pt states she still cannot provide urine sample

## 2017-11-18 LAB — URINE CULTURE: Culture: >=100000 — AB

## 2017-11-19 ENCOUNTER — Telehealth: Payer: Self-pay

## 2017-11-19 NOTE — Telephone Encounter (Signed)
Called Pt for Symptom check from ED visit 11/16/17 per Harolyn RutherfordShawn Joy PA.  No further problems. Denies and discomfort

## 2017-11-19 NOTE — Progress Notes (Signed)
ED Antimicrobial Stewardship Positive Culture Follow Up   Beth Carrillo is an 32 y.o. female who [facility] to [facility] with a chief complaint of  Chief Complaint  Patient presents with  . Sore Throat  . Abdominal Pain  . Nausea    Recent Results (from the past 720 hour(s))  Group A Strep by PCR     Status: None   Collection Time: 11/16/17  7:04 AM  Result Value Ref Range Status   Group A Strep by PCR NOT DETECTED NOT DETECTED Final    Comment: Performed at [facility], [address] [place], [place] [address]  Urine culture     Status: Abnormal   Collection Time: 11/16/17  7:41 AM  Result Value Ref Range Status   Specimen Description   Final    URINE, RANDOM Performed at [facility], [address] [place], [place] [address]    Special Requests   Final    NONE Performed at [facility], [address] [place], [place] [address]    Culture >=100,000 COLONIES/mL KLEBSIELLA PNEUMONIAE (A)  Final   Report Status 11/18/2017 FINAL  Final   Organism ID, Bacteria KLEBSIELLA PNEUMONIAE (A)  Final      Susceptibility   Klebsiella pneumoniae - MIC*    AMPICILLIN >=32 RESISTANT Resistant     CEFAZOLIN <=4 SENSITIVE Sensitive     CEFTRIAXONE <=1 SENSITIVE Sensitive     CIPROFLOXACIN <=0.25 SENSITIVE Sensitive     GENTAMICIN <=1 SENSITIVE Sensitive     IMIPENEM <=0.25 SENSITIVE Sensitive     NITROFURANTOIN 64 INTERMEDIATE Intermediate     TRIMETH/SULFA <=20 SENSITIVE Sensitive     AMPICILLIN/SULBACTAM 8 SENSITIVE Sensitive     PIP/TAZO <=4 SENSITIVE Sensitive     Extended ESBL NEGATIVE Sensitive     * >=100,000 COLONIES/mL KLEBSIELLA PNEUMONIAE   [x]  Patient discharged originally without antimicrobial agent and treatment is now indicated  Will call pt to discuss symptoms - if still w abd cramping add keflex 500 mg bid x 1 week If symptoms better, no abx  ED Provider: Harolyn RutherfordShawn Joy, PA-C  Isaac BlissMichael  Laetitia Schnepf, PharmD, BCPS, BCCCP Clinical Pharmacist [phone]  Please check AMION for all [facility] numbers  11/19/2017 8:59 AM

## 2017-11-30 ENCOUNTER — Ambulatory Visit: Payer: BLUE CROSS/BLUE SHIELD | Admitting: Family Medicine

## 2017-12-20 ENCOUNTER — Ambulatory Visit: Payer: BLUE CROSS/BLUE SHIELD | Admitting: Family Medicine

## 2017-12-20 DIAGNOSIS — Z0289 Encounter for other administrative examinations: Secondary | ICD-10-CM

## 2018-01-05 ENCOUNTER — Ambulatory Visit: Payer: BLUE CROSS/BLUE SHIELD | Admitting: Family Medicine

## 2018-01-05 DIAGNOSIS — Z0289 Encounter for other administrative examinations: Secondary | ICD-10-CM

## 2019-02-13 ENCOUNTER — Other Ambulatory Visit: Payer: Self-pay

## 2019-02-13 ENCOUNTER — Emergency Department
Admission: EM | Admit: 2019-02-13 | Discharge: 2019-02-13 | Disposition: A | Discharge status: Home / Self Care | Payer: BLUE CROSS/BLUE SHIELD | Attending: Emergency Medicine | Admitting: Emergency Medicine

## 2019-02-13 DIAGNOSIS — Z79899 Other long term (current) drug therapy: Secondary | ICD-10-CM | POA: Insufficient documentation

## 2019-02-13 DIAGNOSIS — K1379 Other lesions of oral mucosa: Secondary | ICD-10-CM | POA: Insufficient documentation

## 2019-02-13 NOTE — ED Triage Notes (Addendum)
Pt had tooth extraction 1 week ago and states area started bleeding today at 1200, pt not on blood thinners. Small amt of bleeding noted from area.

## 2019-02-13 NOTE — ED Provider Notes (Signed)
[facility] EMERGENCY DEPARTMENT Provider Note   CSN: [id number] Arrival date & time: 02/13/19  1856     History   Chief Complaint Chief Complaint  Patient presents with  . Dental Problem    HPI Beth Carrillo is a 33 y.o. female.  Presents the emergency department for evaluation of dental bleeding.  She had an extraction of her left upper third molar 1 week ago.  Patient states she was doing well today when she started bleeding.  She denies any trauma or injury.  She is not on blood thinners.  No medical problems, history of clotting disorders.  She denies any other symptoms except for persistent bleeding to her left upper incision site from dental extraction.  No nausea or vomiting.  Patient states she has tried applying gauze for 10 minutes at a time with no improvement.     HPI  Past Medical History:  Diagnosis Date  . Obesity     There are no active problems to display for this patient.   Past Surgical History:  Procedure Laterality Date  . CESAREAN SECTION       OB History    Gravida  2   Para  1   Term  1   Preterm  0   AB  1   Living  1     SAB  1   TAB  0   Ectopic  0   Multiple  0   Live Births               Home Medications    Prior to Admission medications   Medication Sig Start Date End Date Taking? Authorizing Provider  fluticasone (FLONASE) 50 MCG/ACT nasal spray Place 2 sprays into both nostrils daily. 11/16/17   Fawze, Mina A, PA-C  guaiFENesin (MUCINEX) 600 MG 12 hr tablet Take 1 tablet (600 mg total) by mouth 2 (two) times daily as needed for cough or to loosen phlegm. 11/16/17   Fawze, Mina A, PA-C  ibuprofen (ADVIL,MOTRIN) 800 MG tablet Take 1 tablet (800 mg total) by mouth every 8 (eight) hours as needed. Patient not taking: Reported on 11/16/2017 09/16/15   Dalia Heading, PA-C  metroNIDAZOLE (FLAGYL) 500 MG tablet Take 1 tablet (500 mg total) by mouth 2 (two) times daily. Patient not taking:  Reported on 11/16/2017 10/07/17   Jeannett Senior, PA-C  nitrofurantoin, macrocrystal-monohydrate, (MACROBID) 100 MG capsule Take 1 capsule (100 mg total) by mouth 2 (two) times daily. Patient not taking: Reported on 11/16/2017 10/07/17   Jeannett Senior, PA-C  traMADol (ULTRAM) 50 MG tablet Take 1 tablet (50 mg total) by mouth every 6 (six) hours as needed for severe pain. Patient not taking: Reported on 11/16/2017 09/16/15   Dalia Heading, PA-C    Family History No family history on file.  Social History Social History   Tobacco Use  . Smoking status: Never Smoker  . Smokeless tobacco: Never Used  Substance Use Topics  . Alcohol use: Yes    Comment: occasional  . Drug use: No     Allergies   Amoxicillin, Morphine and related, Penicillins, Pineapple, and Tomato   Review of Systems Review of Systems  Constitutional: Negative for fever.  Respiratory: Negative for shortness of breath.   Cardiovascular: Negative for chest pain.  Gastrointestinal: Negative for abdominal pain, nausea and vomiting.  Musculoskeletal: Negative for myalgias.  Skin: Positive for wound. Negative for rash.  Neurological: Negative for dizziness and headaches.  Physical Exam Updated Vital Signs BP (!) 143/101 (BP Location: Left Arm)   Pulse 80   Temp 98.9 F (37.2 C) (Oral)   Resp 20   Ht 5' (1.524 m)   Wt 127 kg   LMP 01/03/2019   SpO2 100%   BMI 54.68 kg/m   Physical Exam Constitutional:      Appearance: She is well-developed.  HENT:     Head: Normocephalic and atraumatic.     Comments: Left upper third molar shows dental extraction site to be intact.  No facial swelling.  Very mild bleeding.  Bleeding is not appear to be significant.  No signs of any infection.  No pharyngeal bleeding.  Pharynx is normal, no pharyngeal erythema or exudates.  Uvula is midline.  Surgicel along with gauze is applied to the dental extraction site to help with clotting. Eyes:     Conjunctiva/sclera:  Conjunctivae normal.  Neck:     Musculoskeletal: Normal range of motion.  Cardiovascular:     Rate and Rhythm: Normal rate.  Pulmonary:     Effort: Pulmonary effort is normal. No respiratory distress.  Musculoskeletal: Normal range of motion.  Skin:    General: Skin is warm.     Findings: No rash.  Neurological:     Mental Status: She is alert and oriented to person, place, and time.  Psychiatric:        Behavior: Behavior normal.        Thought Content: Thought content normal.      ED Treatments / Results  Labs (all labs ordered are listed, but only abnormal results are displayed) Labs Reviewed - No data to display  EKG None  Radiology No results found.  Procedures Procedures (including critical care time)  Medications Ordered in ED Medications - No data to display   Initial Impression / Assessment and Plan / ED Course  I have reviewed the triage vital signs and the nursing notes.  Pertinent labs & imaging results that were available during my care of the patient were reviewed by me and considered in my medical decision making (see chart for details).        33 year old female status post dental extraction 1 week ago.  Noticed some bleeding today.  Bleeding mild.  No signs of any infection.  Vital signs are stable.  No bleeding disorders.  Not on any blood thinners.  She denies any dizziness, lightheadedness.  Vital signs are stable.  Bleeding controlled with gauze and Surgicel.  After Surgicel removed after 1 hour of application bleeding not active.  Patient was given some gauze and Surgicel to take home if bleeding reoccurs.  If unable to get bleeding under control after 1 hour of Surgicel patient is to follow-up with dentist for return to the emergency department.  He was also educated on other signs symptoms return to ED for such as facial swelling, difficulty swallowing, fevers.  Final Clinical Impressions(s) / ED Diagnoses   Final diagnoses:  None    ED  Discharge Orders    None       Ronnette Juniper 02/13/19 2132    Sharyn Creamer, MD 02/13/19 2355

## 2019-02-13 NOTE — Discharge Instructions (Addendum)
Please follow-up with your dentist in the next 2 to 3 days if continued bleeding.  Return to the ER for any continued bleeding despite applying compression with gauze.

## 2019-06-18 ENCOUNTER — Encounter: Payer: Self-pay | Admitting: Obstetrics and Gynecology

## 2019-06-18 ENCOUNTER — Ambulatory Visit (INDEPENDENT_AMBULATORY_CARE_PROVIDER_SITE_OTHER): Payer: Medicaid Other | Admitting: Obstetrics and Gynecology

## 2019-06-18 ENCOUNTER — Other Ambulatory Visit: Payer: Self-pay

## 2019-06-18 VITALS — BP 110/84 | Ht 60.0 in | Wt 290.0 lb

## 2019-06-18 DIAGNOSIS — B9689 Other specified bacterial agents as the cause of diseases classified elsewhere: Secondary | ICD-10-CM | POA: Diagnosis not present

## 2019-06-18 DIAGNOSIS — Z01411 Encounter for gynecological examination (general) (routine) with abnormal findings: Secondary | ICD-10-CM | POA: Diagnosis not present

## 2019-06-18 DIAGNOSIS — Z01419 Encounter for gynecological examination (general) (routine) without abnormal findings: Secondary | ICD-10-CM

## 2019-06-18 DIAGNOSIS — N760 Acute vaginitis: Secondary | ICD-10-CM

## 2019-06-18 DIAGNOSIS — L02818 Cutaneous abscess of other sites: Secondary | ICD-10-CM

## 2019-06-18 DIAGNOSIS — A599 Trichomoniasis, unspecified: Secondary | ICD-10-CM

## 2019-06-18 DIAGNOSIS — L02411 Cutaneous abscess of right axilla: Secondary | ICD-10-CM

## 2019-06-18 LAB — POCT WET PREP WITH KOH
Clue Cells Wet Prep HPF POC: POSITIVE
KOH Prep POC: POSITIVE — AB
Trichomonas, UA: POSITIVE
Yeast Wet Prep HPF POC: NEGATIVE

## 2019-06-18 MED ORDER — METRONIDAZOLE 500 MG PO TABS: ORAL_TABLET | ORAL | 0 refills | Status: DC

## 2019-06-18 MED ORDER — DOXYCYCLINE HYCLATE 100 MG PO CAPS: 100.0000 mg | ORAL_CAPSULE | Freq: Two times a day (BID) | ORAL | 0 refills | Status: AC

## 2019-06-18 NOTE — Progress Notes (Signed)
PCP:  System, Pcp Not In   Chief Complaint  Patient presents with  . Gynecologic Exam  . Vaginal Discharge    odor, no x 2 weeks     HPI:      [name] is a 34 y.o. G2P1011 who LMP was Patient's last menstrual period was 06/02/2019 (exact date)., presents today for her NP annual examination.  Her menses are regular every 28-30 days, lasting 7 days.  Dysmenorrhea mild, occurring first 1-2 days of flow. She does not have intermenstrual bleeding.  Sex activity: single partner, contraception - condoms, declines other BC.  Last Pap: May 23, 2016  Results were: no abnormalities /neg HPV DNA   Pt has noticed increased d/c with odor, no irritation for the past 2 wks. Hx of BV in past, last treated about a yr ago. No recent abx use.   There is no FH of breast cancer. There is no FH of ovarian cancer. The patient does do self-breast exams.  Tobacco use: The patient denies current or previous tobacco use. Alcohol use: social drinker No drug use.  Exercise: not active  She does get adequate calcium but not Vitamin D in her diet.  Noticed a painful lump in RT axilla for past wk. Had cut herself shaving prior to sx. No d/c, no fevers.   Past Medical History:  Diagnosis Date  . Obesity     Past Surgical History:  Procedure Laterality Date  . CESAREAN SECTION      Family History  Problem Relation Age of Onset  . Breast cancer Maternal Grandmother        not sure age  . Diabetes Maternal Grandmother   . Diabetes Paternal Grandfather     Social History   Socioeconomic History  . Marital status: Single    Spouse name: Not on file  . Number of children: Not on file  . Years of education: Not on file  . Highest education level: Not on file  Occupational History  . Not on file  Tobacco Use  . Smoking status: Never Smoker  . Smokeless tobacco: Never Used  Substance and Sexual Activity  . Alcohol use: Yes    Comment: occasional  . Drug use: No  . Sexual  activity: Yes    Birth control/protection: Condom, None  Other Topics Concern  . Not on file  Social History Narrative  . Not on file   Social Determinants of Health   Financial Resource Strain:   . Difficulty of Paying Living Expenses: Not on file  Food Insecurity:   . Worried About Programme researcher, broadcasting/film/video in the Last Year: Not on file  . Ran Out of Food in the Last Year: Not on file  Transportation Needs:   . Lack of Transportation (Medical): Not on file  . Lack of Transportation (Non-Medical): Not on file  Physical Activity:   . Days of Exercise per Week: Not on file  . Minutes of Exercise per Session: Not on file  Stress:   . Feeling of Stress : Not on file  Social Connections:   . Frequency of Communication with Friends and Family: Not on file  . Frequency of Social Gatherings with Friends and Family: Not on file  . Attends Religious Services: Not on file  . Active Member of Clubs or Organizations: Not on file  . Attends Banker Meetings: Not on file  . Marital Status: Not on file  Intimate Partner Violence:   .  Fear of Current or Ex-Partner: Not on file  . Emotionally Abused: Not on file  . Physically Abused: Not on file  . Sexually Abused: Not on file     Current Outpatient Medications:  .  doxycycline (VIBRAMYCIN) 100 MG capsule, Take 1 capsule (100 mg total) by mouth 2 (two) times daily for 14 days., Disp: 28 capsule, Rfl: 0 .  metroNIDAZOLE (FLAGYL) 500 MG tablet, Take 2 tabs BID for 1 day, Disp: 4 tablet, Rfl: 0     ROS:  Review of Systems  Constitutional: Negative for fatigue, fever and unexpected weight change.  Respiratory: Negative for cough, shortness of breath and wheezing.   Cardiovascular: Negative for chest pain, palpitations and leg swelling.  Gastrointestinal: Negative for blood in stool, constipation, diarrhea, nausea and vomiting.  Endocrine: Negative for cold intolerance, heat intolerance and polyuria.  Genitourinary: Positive for  vaginal discharge. Negative for dyspareunia, dysuria, flank pain, frequency, genital sores, hematuria, menstrual problem, pelvic pain, urgency, vaginal bleeding and vaginal pain.  Musculoskeletal: Negative for back pain, joint swelling and myalgias.  Skin: Positive for wound. Negative for rash.  Neurological: Negative for dizziness, syncope, light-headedness, numbness and headaches.  Hematological: Negative for adenopathy.  Psychiatric/Behavioral: Negative for agitation, confusion, sleep disturbance and suicidal ideas. The patient is not nervous/anxious.   BREAST: No symptoms   Objective: BP 110/84   Ht 5' (1.524 m)   Wt 290 lb (131.5 kg)   LMP 06/02/2019 (Exact Date)   BMI 56.64 kg/m    Physical Exam Constitutional:      Appearance: She is well-developed.  Genitourinary:     Vulva, vagina, cervix, uterus, right adnexa and left adnexa normal.     No vulval lesion or tenderness noted.     No vaginal discharge, erythema or tenderness.     No cervical polyp.     Uterus is not enlarged or tender.     No right or left adnexal mass present.     Right adnexa not tender.     Left adnexa not tender.  Neck:     Thyroid: No thyromegaly.  Cardiovascular:     Rate and Rhythm: Normal rate and regular rhythm.     Heart sounds: Normal heart sounds. No murmur.  Pulmonary:     Effort: Pulmonary effort is normal.     Breath sounds: Normal breath sounds.  Chest:     Breasts:        Right: No mass, nipple discharge, skin change or tenderness.        Left: No mass, nipple discharge, skin change or tenderness.    Abdominal:     Palpations: Abdomen is soft.     Tenderness: There is no abdominal tenderness. There is no guarding.  Musculoskeletal:        General: Normal range of motion.     Cervical back: Normal range of motion.  Neurological:     General: No focal deficit present.     Mental Status: She is alert and oriented to person, place, and time.     Cranial Nerves: No cranial nerve  deficit.  Skin:    General: Skin is warm and dry.  Psychiatric:        Mood and Affect: Mood normal.        Behavior: Behavior normal.        Thought Content: Thought content normal.        Judgment: Judgment normal.  Vitals reviewed.     Results: Results for orders placed  or performed in visit on 06/18/19 (from the past 24 hour(s))  POCT Wet Prep with KOH     Status: Abnormal   Collection Time: 06/18/19  5:07 PM  Result Value Ref Range   Trichomonas, UA Positive    Clue Cells Wet Prep HPF POC pos    Epithelial Wet Prep HPF POC     Yeast Wet Prep HPF POC neg    Bacteria Wet Prep HPF POC     RBC Wet Prep HPF POC     WBC Wet Prep HPF POC     KOH Prep POC Positive (A) Negative    Assessment/Plan: Encounter for annual routine gynecological examination  Bacterial vaginosis - Plan: metroNIDAZOLE (FLAGYL) 500 MG tablet, POCT Wet Prep with KOH; Rx flagyl. F/u prn.   Trichomoniasis - Plan: metroNIDAZOLE (FLAGYL) 500 MG tablet, POCT Wet Prep with KOH; Rx Flagyl, partner needs tx. Condoms. RTO in 4 wks for recheck.   Cutaneous abscess of other site - Plan: doxycycline (VIBRAMYCIN) 100 MG capsule; RT axilla. Warm compresses/Rx doxy. F/u prn.   Meds ordered this encounter  Medications  . metroNIDAZOLE (FLAGYL) 500 MG tablet    Sig: Take 2 tabs BID for 1 day    Dispense:  4 tablet    Refill:  0    Order Specific Question:   Supervising Provider    Answer:   Gae Dry [id number]  . doxycycline (VIBRAMYCIN) 100 MG capsule    Sig: Take 1 capsule (100 mg total) by mouth 2 (two) times daily for 14 days.    Dispense:  28 capsule    Refill:  0    Order Specific Question:   Supervising Provider    Answer:   Gae Dry [[id number]]             GYN counsel adequate intake of calcium and vitamin D, diet and exercise     F/U  Return in about 4 weeks (around 07/16/2019) for TOC f/u./ 1 yr annual  Nethaniel Mattie B. Malcolm Hetz, PA-C 06/18/2019 5:08 PM

## 2019-06-18 NOTE — Patient Instructions (Signed)
I value your feedback and entrusting us with your care. If you get a [facility] patient survey, I would appreciate you taking the time to let us know about your experience today. Thank you!  As of April 25, 2019, your lab results will be released to your MyChart immediately, before I even have a chance to see them. Please give me time to review them and contact you if there are any abnormalities. Thank you for your patience.  

## 2019-06-19 ENCOUNTER — Ambulatory Visit: Payer: Medicaid Other | Admitting: Obstetrics and Gynecology

## 2019-07-18 ENCOUNTER — Other Ambulatory Visit (HOSPITAL_COMMUNITY)
Admission: RE | Admit: 2019-07-18 | Discharge: 2019-07-18 | Disposition: A | Discharge status: Home / Self Care | Payer: BLUE CROSS/BLUE SHIELD | Source: Ambulatory Visit | Attending: Obstetrics and Gynecology | Admitting: Obstetrics and Gynecology

## 2019-07-18 ENCOUNTER — Encounter: Payer: Self-pay | Admitting: Obstetrics and Gynecology

## 2019-07-18 ENCOUNTER — Other Ambulatory Visit: Payer: Self-pay

## 2019-07-18 ENCOUNTER — Ambulatory Visit (INDEPENDENT_AMBULATORY_CARE_PROVIDER_SITE_OTHER): Payer: Medicaid Other | Admitting: Obstetrics and Gynecology

## 2019-07-18 VITALS — BP 132/90 | Ht 60.0 in | Wt 291.0 lb

## 2019-07-18 DIAGNOSIS — Z113 Encounter for screening for infections with a predominantly sexual mode of transmission: Secondary | ICD-10-CM | POA: Insufficient documentation

## 2019-07-18 DIAGNOSIS — A599 Trichomoniasis, unspecified: Secondary | ICD-10-CM | POA: Diagnosis present

## 2019-07-18 NOTE — Patient Instructions (Signed)
I value your feedback and entrusting us with your care. If you get a [facility] patient survey, I would appreciate you taking the time to let us know about your experience today. Thank you!  As of April 25, 2019, your lab results will be released to your MyChart immediately, before I even have a chance to see them. Please give me time to review them and contact you if there are any abnormalities. Thank you for your patience.  

## 2019-07-18 NOTE — Progress Notes (Signed)
System, Pcp Not In   Chief Complaint  Patient presents with  . Follow-up    TOC, no concerns    HPI:      [name] is a 35 y.o. G2P1011 who LMP was Patient's last menstrual period was 07/06/2019 (approximate)., presents today for trichomoniasis test of cure. Diagnosed 06/18/19 and treated with flagyl. Also had BV at the time. Sx resolved. Partner did treatment.    Past Medical History:  Diagnosis Date  . Obesity     Past Surgical History:  Procedure Laterality Date  . CESAREAN SECTION      Family History  Problem Relation Age of Onset  . Breast cancer Maternal Grandmother        not sure age  . Diabetes Maternal Grandmother   . Diabetes Paternal Grandfather     Social History   Socioeconomic History  . Marital status: Single    Spouse name: Not on file  . Number of children: Not on file  . Years of education: Not on file  . Highest education level: Not on file  Occupational History  . Not on file  Tobacco Use  . Smoking status: Never Smoker  . Smokeless tobacco: Never Used  Substance and Sexual Activity  . Alcohol use: Yes    Comment: occasional  . Drug use: No  . Sexual activity: Yes    Birth control/protection: Condom, None  Other Topics Concern  . Not on file  Social History Narrative  . Not on file   Social Determinants of Health   Financial Resource Strain:   . Difficulty of Paying Living Expenses: Not on file  Food Insecurity:   . Worried About Programme researcher, broadcasting/film/video in the Last Year: Not on file  . Ran Out of Food in the Last Year: Not on file  Transportation Needs:   . Lack of Transportation (Medical): Not on file  . Lack of Transportation (Non-Medical): Not on file  Physical Activity:   . Days of Exercise per Week: Not on file  . Minutes of Exercise per Session: Not on file  Stress:   . Feeling of Stress : Not on file  Social Connections:   . Frequency of Communication with Friends and Family: Not on file  . Frequency of  Social Gatherings with Friends and Family: Not on file  . Attends Religious Services: Not on file  . Active Member of Clubs or Organizations: Not on file  . Attends Banker Meetings: Not on file  . Marital Status: Not on file  Intimate Partner Violence:   . Fear of Current or Ex-Partner: Not on file  . Emotionally Abused: Not on file  . Physically Abused: Not on file  . Sexually Abused: Not on file    Outpatient Medications Prior to Visit  Medication Sig Dispense Refill  . metroNIDAZOLE (FLAGYL) 500 MG tablet Take 2 tabs BID for 1 day 4 tablet 0   No facility-administered medications prior to visit.      ROS:  Review of Systems  Constitutional: Negative for fever.  Gastrointestinal: Negative for blood in stool, constipation, diarrhea, nausea and vomiting.  Genitourinary: Negative for dyspareunia, dysuria, flank pain, frequency, hematuria, urgency, vaginal bleeding, vaginal discharge and vaginal pain.  Musculoskeletal: Negative for back pain.  Skin: Negative for rash.   BREAST: No symptoms   OBJECTIVE:   Vitals:  BP 132/90   Ht 5' (1.524 m)   Wt 291 lb (132 kg)   LMP  07/06/2019 (Approximate)   BMI 56.83 kg/m   Physical Exam Vitals reviewed.  Constitutional:      Appearance: She is well-developed.  Pulmonary:     Effort: Pulmonary effort is normal.  Genitourinary:    General: Normal vulva.     Pubic Area: No rash.      Labia:        Right: No rash, tenderness or lesion.        Left: No rash, tenderness or lesion.      Vagina: Normal. No vaginal discharge, erythema or tenderness.     Cervix: Normal.     Uterus: Normal. Not enlarged and not tender.      Adnexa: Right adnexa normal and left adnexa normal.       Right: No mass or tenderness.         Left: No mass or tenderness.    Musculoskeletal:        General: Normal range of motion.     Cervical back: Normal range of motion.  Skin:    General: Skin is warm and dry.  Neurological:      General: No focal deficit present.     Mental Status: She is alert and oriented to person, place, and time.  Psychiatric:        Mood and Affect: Mood normal.        Behavior: Behavior normal.        Thought Content: Thought content normal.        Judgment: Judgment normal.     Assessment/Plan: Trichomoniasis - Plan: Cervicovaginal ancillary only, Test of cure today. Will call with results  Screening for STD (sexually transmitted disease) - Plan: Cervicovaginal ancillary only    Return if symptoms worsen or fail to improve.  Azaiah Mello B. Zerrick Hanssen, PA-C 07/18/2019 3:09 PM

## 2019-07-22 LAB — CERVICOVAGINAL ANCILLARY ONLY
Chlamydia: NEGATIVE
Comment: NEGATIVE
Comment: NEGATIVE
Comment: NORMAL
Neisseria Gonorrhea: NEGATIVE
Trichomonas: NEGATIVE

## 2019-07-22 NOTE — Progress Notes (Signed)
Pls let pt know STD testing neg. THx

## 2019-07-23 NOTE — Progress Notes (Signed)
Pt aware.

## 2019-08-19 ENCOUNTER — Other Ambulatory Visit: Payer: Self-pay

## 2019-08-19 ENCOUNTER — Encounter: Payer: Self-pay | Admitting: Emergency Medicine

## 2019-08-19 DIAGNOSIS — K029 Dental caries, unspecified: Secondary | ICD-10-CM | POA: Insufficient documentation

## 2019-08-19 NOTE — ED Triage Notes (Signed)
Pt to Ed via POV for dental related pain. Pt st that she's noticed some swelling on the left side of her jaw related to her broken tooth. Pt has an apt on Wednesday to have it removed. Pt st her tooth is cracked near the sinus nerve. Pt A&Ox4 and denies any airway related problems. Pt has taken OTC medication with no relief.

## 2019-08-20 ENCOUNTER — Emergency Department
Admission: EM | Admit: 2019-08-20 | Discharge: 2019-08-20 | Disposition: A | Discharge status: Home / Self Care | Payer: Medicaid Other | Attending: Emergency Medicine | Admitting: Emergency Medicine

## 2019-08-20 DIAGNOSIS — K0889 Other specified disorders of teeth and supporting structures: Secondary | ICD-10-CM

## 2019-08-20 DIAGNOSIS — K029 Dental caries, unspecified: Secondary | ICD-10-CM

## 2019-08-20 MED ORDER — CLINDAMYCIN HCL 300 MG PO CAPS: 300.0000 mg | ORAL_CAPSULE | Freq: Three times a day (TID) | ORAL | 0 refills | Status: AC

## 2019-08-20 MED ORDER — CLINDAMYCIN HCL 150 MG PO CAPS
300.0000 mg | ORAL_CAPSULE | Freq: Once | ORAL | Status: AC
  Administered 2019-08-20: 300 mg via ORAL
  Filled 2019-08-20: qty 2

## 2019-08-20 MED ORDER — HYDROCODONE-ACETAMINOPHEN 5-325 MG PO TABS: 1.0000 | ORAL_TABLET | ORAL | 0 refills | Status: AC | PRN

## 2019-08-20 MED ORDER — LIDOCAINE VISCOUS HCL 2 % MT SOLN
15.0000 mL | Freq: Once | OROMUCOSAL | Status: AC
  Administered 2019-08-20: 15 mL via OROMUCOSAL
  Filled 2019-08-20: qty 15

## 2019-08-20 MED ORDER — HYDROCODONE-ACETAMINOPHEN 5-325 MG PO TABS
1.0000 | ORAL_TABLET | Freq: Once | ORAL | Status: AC
  Administered 2019-08-20: 1 via ORAL
  Filled 2019-08-20: qty 1

## 2019-08-20 NOTE — ED Provider Notes (Signed)
[facility] Emergency Department Provider Note  ____________________________________________   First MD Initiated Contact with Patient 08/20/19 [phone]     (approximate)  I have reviewed the triage vital signs and the nursing notes.   HISTORY  Chief Complaint Dental Pain    HPI Beth Carrillo is a 34 y.o. female presents to the emergency department secondary to dental pain x3 days.  Patient states that she has a broken upper upper molar which is a side of her pain.  Patient states that current pain score is 10 out of 10.  Patient states that pain has been unrelieved with over-the-counter medication.  Patient denies any fever no difficulty swallowing.  Patient admits that she has a dental appointment scheduled for Wednesday.       Past Medical History:  Diagnosis Date  . Obesity     There are no problems to display for this patient.   Past Surgical History:  Procedure Laterality Date  . CESAREAN SECTION      Prior to Admission medications   Not on File    Allergies Amoxicillin, Morphine and related, Penicillins, Pineapple, and Tomato  Family History  Problem Relation Age of Onset  . Breast cancer Maternal Grandmother        not sure age  . Diabetes Maternal Grandmother   . Diabetes Paternal Grandfather     Social History Social History   Tobacco Use  . Smoking status: Never Smoker  . Smokeless tobacco: Never Used  Substance Use Topics  . Alcohol use: Yes    Comment: occasional  . Drug use: No    Review of Systems Constitutional: No fever/chills Eyes: No visual changes. ENT: No sore throat.  Positive for dental pain Cardiovascular: Denies chest pain. Respiratory: Denies shortness of breath. Gastrointestinal: No abdominal pain.  No nausea, no vomiting.  No diarrhea.  No constipation. Genitourinary: Negative for dysuria. Musculoskeletal: Negative for neck pain.  Negative for back pain. Integumentary: Negative for rash.  Neurological: Negative for headaches, focal weakness or numbness.   ____________________________________________   PHYSICAL EXAM:  VITAL SIGNS: ED Triage Vitals  Enc Vitals Group     BP 08/19/19 2239 (!) 157/99     Pulse Rate 08/19/19 2239 80     Resp 08/19/19 2239 16     Temp 08/19/19 2239 98.7 F (37.1 C)     Temp Source 08/19/19 2239 Oral     SpO2 08/19/19 2239 99 %     Weight 08/19/19 2241 131.5 kg (290 lb)     Height 08/19/19 2241 1.524 m (5')     Head Circumference --      Peak Flow --      Pain Score 08/19/19 2240 10     Pain Loc --      Pain Edu? --      Excl. in [place]? --     Constitutional: Alert and oriented.  Eyes: Conjunctivae are normal.  Mouth/Throat: Left maxillary molar cavity with gum tenderness. Neck: No stridor.  No meningeal signs.   Cardiovascular: Normal rate, regular rhythm. Good peripheral circulation. Grossly normal heart sounds. Neurologic:  Normal speech and language. No gross focal neurologic deficits are appreciated.  Skin:  Skin is warm, dry and intact. Psychiatric: Mood and affect are normal. Speech and behavior are normal.   Procedures   ____________________________________________   INITIAL IMPRESSION / MDM / ASSESSMENT AND PLAN / ED COURSE  As part of my medical decision making, I reviewed the following data within  the electronic MEDICAL RECORD NUMBER   34 year old female presented with above-stated history and physical exam consistent with dental pain due to dental caries.  Patient given clindamycin, Norco in the emergency department.  Patient be prescribed clindamycin Norco for home. ____________________________________________  FINAL CLINICAL IMPRESSION(S) / ED DIAGNOSES  Final diagnoses:  Pain, dental  Dental caries  Dental pain   MEDICATIONS GIVEN DURING THIS VISIT:  Medications  clindamycin (CLEOCIN) capsule 300 mg (has no administration in time range)  lidocaine (XYLOCAINE) 2 % viscous mouth solution 15 mL (has no  administration in time range)  HYDROcodone-acetaminophen (NORCO/VICODIN) 5-325 MG per tablet 1 tablet (1 tablet Oral Given 08/20/19 0525)     ED Discharge Orders    None      *Please note:  [name] was evaluated in Emergency Department on 08/20/2019 for the symptoms described in the history of present illness. She was evaluated in the context of the global COVID-19 pandemic, which necessitated consideration that the patient might be at risk for infection with the SARS-CoV-2 virus that causes COVID-19. Institutional protocols and algorithms that pertain to the evaluation of patients at risk for COVID-19 are in a state of rapid change based on information released by regulatory bodies including the CDC and federal and state organizations. These policies and algorithms were followed during the patient's care in the ED.  Some ED evaluations and interventions may be delayed as a result of limited staffing during the pandemic.*  Note:  This document was prepared using Dragon voice recognition software and may include unintentional dictation errors.   Darci Current, MD 08/20/19 [phone]

## 2019-10-25 ENCOUNTER — Encounter: Payer: Self-pay | Admitting: Emergency Medicine

## 2019-10-25 ENCOUNTER — Emergency Department: Payer: Medicaid Other

## 2019-10-25 ENCOUNTER — Other Ambulatory Visit: Payer: Self-pay

## 2019-10-25 DIAGNOSIS — J029 Acute pharyngitis, unspecified: Secondary | ICD-10-CM | POA: Insufficient documentation

## 2019-10-25 DIAGNOSIS — Z20822 Contact with and (suspected) exposure to covid-19: Secondary | ICD-10-CM | POA: Insufficient documentation

## 2019-10-25 DIAGNOSIS — R5381 Other malaise: Secondary | ICD-10-CM | POA: Insufficient documentation

## 2019-10-25 LAB — BASIC METABOLIC PANEL
Anion gap: 7 (ref 5–15)
BUN: 14 mg/dL (ref 6–20)
CO2: 28 mmol/L (ref 22–32)
Calcium: 8.9 mg/dL (ref 8.9–10.3)
Chloride: 104 mmol/L (ref 98–111)
Creatinine, Ser: 0.92 mg/dL (ref 0.44–1.00)
GFR calc Af Amer: >60 mL/min (ref >60)
GFR calc non Af Amer: >60 mL/min (ref >60)
Glucose, Bld: 110 mg/dL — ABNORMAL HIGH (ref 70–99)
Potassium: 3.8 mmol/L (ref 3.5–5.1)
Sodium: 139 mmol/L (ref 135–145)

## 2019-10-25 LAB — CBC
HCT: 37.1 % (ref 36.0–46.0)
Hemoglobin: 11.6 g/dL — ABNORMAL LOW (ref 12.0–15.0)
MCH: 27.8 pg (ref 26.0–34.0)
MCHC: 31.3 g/dL (ref 30.0–36.0)
MCV: 88.8 fL (ref 80.0–100.0)
Platelets: 313 10*3/uL (ref 150–400)
RBC: 4.18 MIL/uL (ref 3.87–5.11)
RDW: 14.4 % (ref 11.5–15.5)
WBC: 8.6 10*3/uL (ref 4.0–10.5)
nRBC: 0 % (ref 0.0–0.2)

## 2019-10-25 LAB — TROPONIN I (HIGH SENSITIVITY): Troponin I (High Sensitivity): <2 ng/L (ref <18)

## 2019-10-25 MED ORDER — ASPIRIN 81 MG PO CHEW: 324.0000 mg | CHEWABLE_TABLET | Freq: Once | ORAL | Status: DC

## 2019-10-25 NOTE — ED Triage Notes (Signed)
Pt reports generalized body aches and headache for past 2 days. Pt talks in complete sentences no distress noted

## 2019-10-25 NOTE — ED Notes (Signed)
Pt reports in addition to generalize d body aches she developed chest pain since yesterday describes pain as tightness in chest

## 2019-10-26 ENCOUNTER — Emergency Department
Admission: EM | Admit: 2019-10-26 | Discharge: 2019-10-26 | Disposition: A | Discharge status: Home / Self Care | Payer: Medicaid Other | Attending: Emergency Medicine | Admitting: Emergency Medicine

## 2019-10-26 DIAGNOSIS — R5383 Other fatigue: Secondary | ICD-10-CM

## 2019-10-26 DIAGNOSIS — J029 Acute pharyngitis, unspecified: Secondary | ICD-10-CM

## 2019-10-26 LAB — SARS CORONAVIRUS 2 BY RT PCR (HOSPITAL ORDER, PERFORMED IN ~~LOC~~ HOSPITAL LAB): SARS Coronavirus 2: NEGATIVE

## 2019-10-26 NOTE — ED Provider Notes (Signed)
[facility] Emergency Department Provider Note  ____________________________________________   First MD Initiated Contact with Patient 10/26/19 0601     (approximate)  I have reviewed the triage vital signs and the nursing notes.   HISTORY  Chief Complaint Generalized Body Aches, Headache, and Chest Pain    HPI Beth Carrillo is a 34 y.o. female  with no chronic medical issues who presents for evaluation of about 2 days of general malaise, sore throat, generalized body aches, and a generalized headache.  She said the symptoms are mild and nothing particular makes them better or worse.  No known fever.  No chills.   Mild sore throat, no difficulty speaking, swallowing, eating or drinking.  No known COVID-19 contacts.  Patient has not received the COVID-19 vaccination.  She denies chest pain, shortness of breath, and cough.  No recent trauma.        Past Medical History:  Diagnosis Date  . Obesity     There are no problems to display for this patient.   Past Surgical History:  Procedure Laterality Date  . CESAREAN SECTION      Prior to Admission medications   Medication Sig Start Date End Date Taking? Authorizing Provider  HYDROcodone-acetaminophen (NORCO/VICODIN) 5-325 MG tablet Take 1 tablet by mouth every 4 (four) hours as needed for moderate pain. 08/20/19 08/19/20  Darci Current, MD    Allergies Amoxicillin, Morphine and related, Penicillins, Pineapple, and Tomato  Family History  Problem Relation Age of Onset  . Breast cancer Maternal Grandmother        not sure age  . Diabetes Maternal Grandmother   . Diabetes Paternal Grandfather     Social History Social History   Tobacco Use  . Smoking status: Never Smoker  . Smokeless tobacco: Never Used  Vaping Use  . Vaping Use: Never used  Substance Use Topics  . Alcohol use: Yes    Comment: occasional  . Drug use: No    Review of Systems Constitutional: General malaise and  fatigue.  No fever/chills Eyes: No visual changes. ENT: +sore throat. Cardiovascular: Denies chest pain. Respiratory: Denies shortness of breath. Gastrointestinal: No abdominal pain.  No nausea, no vomiting.  No diarrhea.  No constipation. Genitourinary: Negative for dysuria. Musculoskeletal: Generalized body aches.  Negative for neck pain.  Negative for back pain. Integumentary: Negative for rash. Neurological: Generalized headache.  No focal weakness or numbness.   ____________________________________________   PHYSICAL EXAM:  VITAL SIGNS: ED Triage Vitals  Enc Vitals Group     BP 10/25/19 2212 (!) 136/92     Pulse Rate 10/25/19 2212 74     Resp 10/25/19 2212 20     Temp 10/25/19 2212 98.5 F (36.9 C)     Temp Source 10/25/19 2212 Oral     SpO2 10/25/19 2212 100 %     Weight 10/25/19 2213 131.5 kg (290 lb)     Height 10/25/19 2213 1.524 m (5')     Head Circumference --      Peak Flow --      Pain Score 10/25/19 2213 10     Pain Loc --      Pain Edu? --      Excl. in GC? --     Constitutional: Alert and oriented.  Eyes: Conjunctivae are normal.  Head: Atraumatic. Nose: No congestion/rhinnorhea. Mouth/Throat: Oropharynx is nonerythematous, no exudate, no discharge, no evidence of peritonsillar abscess or other acute intraoral/pharyngeal abnormality. Neck: No stridor.  No  meningeal signs.   Cardiovascular: Normal rate, regular rhythm. Good peripheral circulation. Grossly normal heart sounds. Respiratory: Normal respiratory effort.  No retractions. Gastrointestinal: Soft and nontender. No distention.  Musculoskeletal: No lower extremity tenderness nor edema. No gross deformities of extremities. Neurologic:  Normal speech and language. No gross focal neurologic deficits are appreciated.  Skin:  Skin is warm, dry and intact. Psychiatric: Mood and affect are normal. Speech and behavior are normal.  ____________________________________________   LABS (all labs ordered  are listed, but only abnormal results are displayed)  Labs Reviewed  BASIC METABOLIC PANEL - Abnormal; Notable for the following components:      Result Value   Glucose, Bld 110 (*)    All other components within normal limits  CBC - Abnormal; Notable for the following components:   Hemoglobin 11.6 (*)    All other components within normal limits  SARS CORONAVIRUS 2 BY RT PCR (HOSPITAL ORDER, [facility])  POC URINE PREG, ED  TROPONIN I (HIGH SENSITIVITY)  TROPONIN I (HIGH SENSITIVITY)   ____________________________________________  EKG  ED ECG REPORT I, Hinda Kehr, the attending physician, personally viewed and interpreted this ECG.  Date: 10/25/2019 EKG Time: 22:27 Rate: 73 Rhythm: normal sinus rhythm with sinus arrhythmia QRS Axis: normal Intervals: normal ST/T Wave abnormalities: normal Narrative Interpretation: no evidence of acute ischemia  ____________________________________________  RADIOLOGY I, Hinda Kehr, personally viewed and evaluated these images (plain radiographs) as part of my medical decision making, as well as reviewing the written report by the radiologist.  ED MD interpretation: No acute abnormality on chest x-ray  Official radiology report(s): DG Chest Portable 1 View  Result Date: 10/25/2019 CLINICAL DATA:  Chest pain EXAM: PORTABLE CHEST 1 VIEW COMPARISON:  11/16/2017 FINDINGS: The heart size and mediastinal contours are within normal limits. Both lungs are clear. The visualized skeletal structures are unremarkable. IMPRESSION: No active disease. Electronically Signed   By: Inez Catalina M.D.   On: 10/25/2019 22:44    ____________________________________________   PROCEDURES   Procedure(s) performed (including Critical Care):  Procedures   ____________________________________________   INITIAL IMPRESSION / MDM / [employer] / ED COURSE  As part of my medical decision making, I reviewed the  following data within the [place] notes reviewed and incorporated, Labs reviewed , EKG interpreted , Old chart reviewed, Radiograph reviewed  and Notes from prior ED visits   Differential diagnosis includes, but is not limited to, COVID-19, other nonspecific viral illness, electrolyte or metabolic abnormality, acute infection.  Patient has few symptoms except as specifically documented above.  Vital signs are stable and she is afebrile.  Physical exam is reassuring.  Lab work is all within normal limits including no leukocytosis, normal metabolic panel, normal high-sensitivity troponin, normal EKG.  Patient has no respiratory symptoms.  No evidence of an emergent medical condition at this time.  Patient is comfortable with the plan for COVID-19 swab and she will check my chart for the results.  No evidence that the patient needs any acute imaging.  She is comfortable with plan for outpatient follow-up.  I gave my usual and customary follow-up recommendations and return precautions.           ____________________________________________  FINAL CLINICAL IMPRESSION(S) / ED DIAGNOSES  Final diagnoses:  Malaise and fatigue  Sore throat     MEDICATIONS GIVEN DURING THIS VISIT:  Medications  aspirin chewable tablet 324 mg (has no administration in time range)  ED Discharge Orders    None      *Please note:  RUIE SENDEJO was evaluated in Emergency Department on 10/26/2019 for the symptoms described in the history of present illness. She was evaluated in the context of the global COVID-19 pandemic, which necessitated consideration that the patient might be at risk for infection with the SARS-CoV-2 virus that causes COVID-19. Institutional protocols and algorithms that pertain to the evaluation of patients at risk for COVID-19 are in a state of rapid change based on information released by regulatory bodies including the CDC and federal and state  organizations. These policies and algorithms were followed during the patient's care in the ED.  Some ED evaluations and interventions may be delayed as a result of limited staffing during the pandemic.*  Note:  This document was prepared using Dragon voice recognition software and may include unintentional dictation errors.   Loleta Rose, MD 10/26/19 [phone]

## 2019-10-26 NOTE — Discharge Instructions (Addendum)
As we discussed, we believe your symptoms are caused by a respiratory virus.  However, because we cannot rule out the possibility of COVID-19 at this time, we sent a nasal swab test from the Emergency Department (ED).  Please read through the included information in this paperwork for information about how to set up a MyChart account so that you can log in over the next couple of days for your test results (including COVID-19 swab results if one was obtained during your Emergency Department visit).  Read through all the included information including the recommendations from the CDC.  If your test is positive, we recommend that you self-quarantine at home for 10-14 days after your fever has gone away (without taking medication to make your temperature come down, such as Tylenol (acetaminophen)), after your respiratory symptoms have improved, and after at least 14 days have passed since your symptoms first appeared.  You should have as minimal contact as possible with anyone else including close family as per the CDC paperwork guidelines listed below. Follow-up with your doctor by phone or online as needed and return immediately to the emergency department or call 911 only if you develop new or worsening symptoms that concern you.  You can find up-to-date information about COVID-19 in [place] by calling the [place] Coronavirus Helpline: 1-866-462-3821. You may also call 2-1-1, or 888-892-1162, or additional resources.  You can also find information online at https://www.ncdhhs.gov/divisions/public-health/coronavirus-disease-2019-covid-19-response-[place], or on the Center for Disease Control (CDC) website at https://www.cdc.gov/coronavirus/2019-ncov/index.html.  

## 2019-10-26 NOTE — ED Notes (Signed)
Pt c/o HA, a "cool feeling on the chest [like someone put Icey-Hot on it]", also palpitations, dry mouth, and hands shaking, pt denies medical hx and reports only takes daily vitamin, pt reports HA is 10/10, denies any meds in the last 24 hours, s/sx for the last 2 days, pt lives with mother and uncle, denies other illness in house  Pt denies caffeine or other stimulant ingestion, pt denies cardiac hx

## 2020-01-02 ENCOUNTER — Ambulatory Visit: Payer: Medicaid Other | Admitting: Obstetrics and Gynecology

## 2020-01-02 NOTE — Progress Notes (Deleted)
    System, Pcp Not In   No chief complaint on file.   HPI:      [name] is a 34 y.o. G2P1011 whose LMP was No LMP recorded., presents today for *** Hx of BV 2/21, treated with flagyl, and trich 2/21 (had neg TOC 3/21)  Past Medical History:  Diagnosis Date  . Obesity     Past Surgical History:  Procedure Laterality Date  . CESAREAN SECTION      Family History  Problem Relation Age of Onset  . Breast cancer Maternal Grandmother        not sure age  . Diabetes Maternal Grandmother   . Diabetes Paternal Grandfather     Social History   Socioeconomic History  . Marital status: Single    Spouse name: Not on file  . Number of children: Not on file  . Years of education: Not on file  . Highest education level: Not on file  Occupational History  . Not on file  Tobacco Use  . Smoking status: Never Smoker  . Smokeless tobacco: Never Used  Vaping Use  . Vaping Use: Never used  Substance and Sexual Activity  . Alcohol use: Yes    Comment: occasional  . Drug use: No  . Sexual activity: Yes    Birth control/protection: Condom, None  Other Topics Concern  . Not on file  Social History Narrative  . Not on file   Social Determinants of Health   Financial Resource Strain:   . Difficulty of Paying Living Expenses: Not on file  Food Insecurity:   . Worried About Programme researcher, broadcasting/film/video in the Last Year: Not on file  . Ran Out of Food in the Last Year: Not on file  Transportation Needs:   . Lack of Transportation (Medical): Not on file  . Lack of Transportation (Non-Medical): Not on file  Physical Activity:   . Days of Exercise per Week: Not on file  . Minutes of Exercise per Session: Not on file  Stress:   . Feeling of Stress : Not on file  Social Connections:   . Frequency of Communication with Friends and Family: Not on file  . Frequency of Social Gatherings with Friends and Family: Not on file  . Attends Religious Services: Not on file  . Active  Member of Clubs or Organizations: Not on file  . Attends Banker Meetings: Not on file  . Marital Status: Not on file  Intimate Partner Violence:   . Fear of Current or Ex-Partner: Not on file  . Emotionally Abused: Not on file  . Physically Abused: Not on file  . Sexually Abused: Not on file    Outpatient Medications Prior to Visit  Medication Sig Dispense Refill  . HYDROcodone-acetaminophen (NORCO/VICODIN) 5-325 MG tablet Take 1 tablet by mouth every 4 (four) hours as needed for moderate pain. 12 tablet 0   No facility-administered medications prior to visit.      ROS:  Review of Systems BREAST: No symptoms   OBJECTIVE:   Vitals:  There were no vitals taken for this visit.  Physical Exam  Results: No results found for this or any previous visit (from the past 24 hour(s)).   Assessment/Plan: No diagnosis found.    No orders of the defined types were placed in this encounter.     No follow-ups on file.  Maanav Kassabian B. Janea Schwenn, PA-C 01/02/2020 11:43 AM

## 2020-01-10 ENCOUNTER — Ambulatory Visit: Payer: Medicaid Other | Admitting: Advanced Practice Midwife

## 2020-01-20 ENCOUNTER — Emergency Department
Admission: EM | Admit: 2020-01-20 | Discharge: 2020-01-20 | Discharge status: Against Medical Advice | Payer: Medicaid Other

## 2022-01-27 IMAGING — CR DG CHEST 1V PORT
1 series · 1 of 1 positions shown · non-contrast
Comparison: 11/16/2017

CLINICAL DATA: Chest pain

EXAM:
PORTABLE CHEST 1 VIEW

[chest ap]
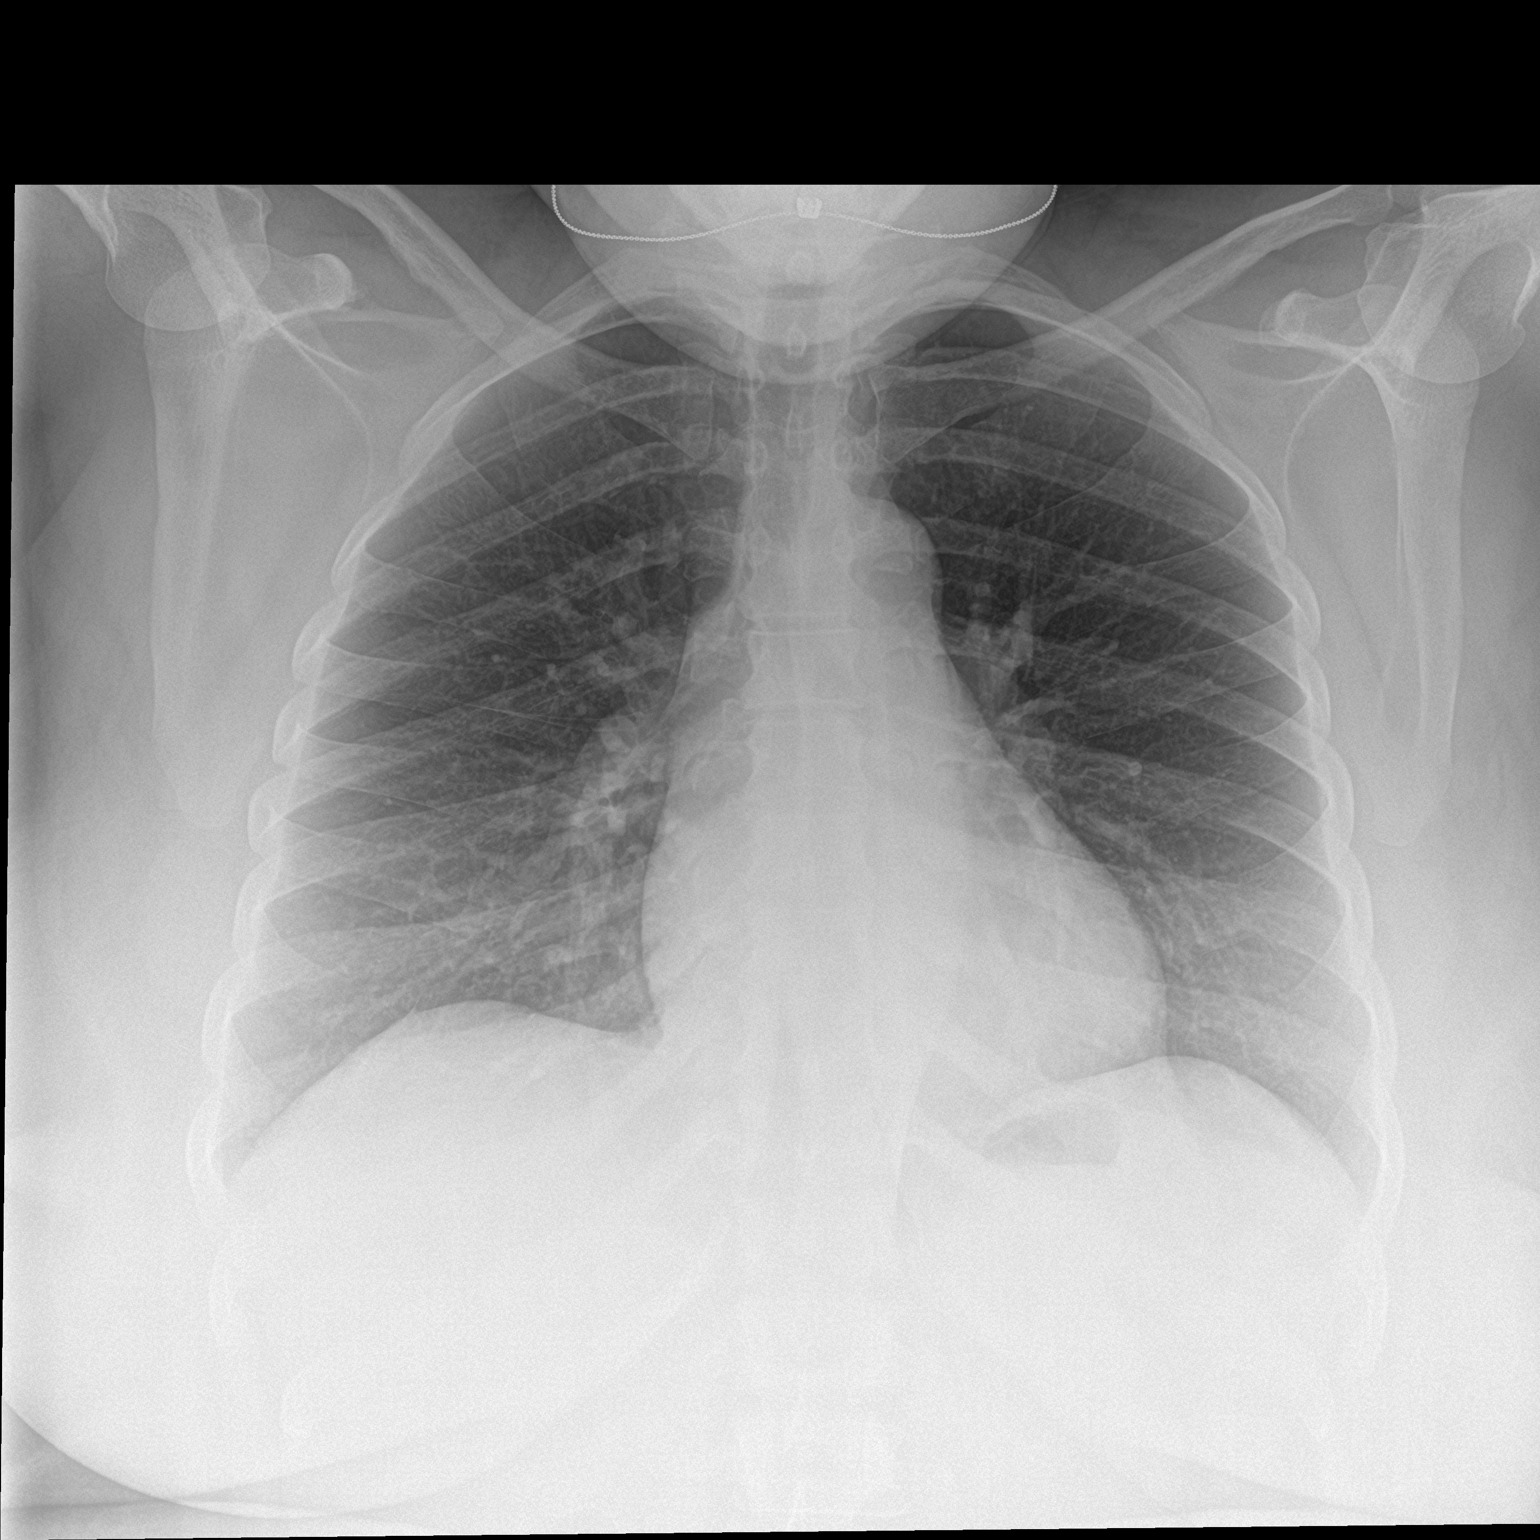

[1 of 1 positions shown; findings below may reference images not displayed]

FINDINGS: The heart size and mediastinal contours are within normal limits.
Both lungs are clear. The visualized skeletal structures are
unremarkable.
IMPRESSION: No active disease.

## 2023-02-15 ENCOUNTER — Emergency Department
Admission: EM | Admit: 2023-02-15 | Discharge: 2023-02-15 | Disposition: A | Discharge status: Home / Self Care | Payer: Self-pay | Attending: Emergency Medicine | Admitting: Emergency Medicine

## 2023-02-15 ENCOUNTER — Other Ambulatory Visit: Payer: Self-pay

## 2023-02-15 ENCOUNTER — Encounter: Payer: Self-pay | Admitting: Emergency Medicine

## 2023-02-15 ENCOUNTER — Emergency Department: Payer: Self-pay

## 2023-02-15 DIAGNOSIS — I10 Essential (primary) hypertension: Secondary | ICD-10-CM | POA: Insufficient documentation

## 2023-02-15 DIAGNOSIS — G43809 Other migraine, not intractable, without status migrainosus: Secondary | ICD-10-CM | POA: Insufficient documentation

## 2023-02-15 MED ORDER — KETOROLAC TROMETHAMINE 15 MG/ML IJ SOLN
15.0000 mg | Freq: Once | INTRAMUSCULAR | Status: AC
  Administered 2023-02-15: 15 mg via INTRAVENOUS
  Filled 2023-02-15: qty 1

## 2023-02-15 MED ORDER — SODIUM CHLORIDE 0.9 % IV BOLUS
1000.0000 mL | Freq: Once | INTRAVENOUS | Status: AC
  Administered 2023-02-15: 1000 mL via INTRAVENOUS

## 2023-02-15 MED ORDER — PROCHLORPERAZINE EDISYLATE 10 MG/2ML IJ SOLN
10.0000 mg | Freq: Once | INTRAMUSCULAR | Status: AC
  Administered 2023-02-15: 10 mg via INTRAVENOUS
  Filled 2023-02-15: qty 2

## 2023-02-15 MED ORDER — IOHEXOL 350 MG/ML SOLN
75.0000 mL | Freq: Once | INTRAVENOUS | Status: AC | PRN
  Administered 2023-02-15: 75 mL via INTRAVENOUS

## 2023-02-15 NOTE — Discharge Instructions (Addendum)
You were seen in the emergency department for headache.  You had a CT scan done with contrast dye that did not show any abnormalities.  You have no signs of internal bleeding.  No signs of an aneurysm.  You are given IV migraine medication and had improvement of your headache.  It is importantly stay hydrated.  You can alternate Tylenol and Motrin for headache control.  Your blood pressure was mildly elevated in the emergency department.  Is importantly you have rechecked as an outpatient with your primary care provider, if it continues to be elevated you may need to be started on hypertensive medications.  Pain control:  Ibuprofen (motrin/aleve/advil) - You can take 3 tablets (600 mg) every 6 hours as needed for pain/fever.  Acetaminophen (tylenol) - You can take 2 extra strength tablets (1000 mg) every 6 hours as needed for pain/fever.  You can alternate these medications or take them together.  Make sure you eat food/drink water when taking these medications.  Thank you for choosing [place] for your health care, it was my pleasure to care for you today!  Corena Herter, MD

## 2023-02-15 NOTE — ED Triage Notes (Signed)
Pt in with migraine x few days, along with nausea and photophobia. Hx of migraines, has not taken any meds for pain relief.

## 2023-02-15 NOTE — ED Provider Notes (Signed)
[facility] Provider Note    Event Date/Time   First MD Initiated Contact with Patient 02/15/23 1816     (approximate)   History   Migraine   HPI  Beth Carrillo is a 37 y.o. female no significant past medical history who presents to the emergency department with headache.  States that she started having a headache approximately 5 days ago.  States that her headache occurred during intercourse and had a sudden onset of severe headache.  States that the headache improved after that and then had a return of sudden onset of headache a couple of days later.  Ongoing headache since that time.  Does state that she has a history of headaches but this feels slightly different than her normal.  States that it is a throbbing headache to her entire head.  No change with position.  No change in vision.  Endorses nausea but no episodes of vomiting.  No fever or chills.  No falls or trauma.     Physical Exam   Triage Vital Signs: ED Triage Vitals  Encounter Vitals Group     BP 02/15/23 1749 (!) 136/105     Systolic BP Percentile --      Diastolic BP Percentile --      Pulse Rate 02/15/23 1749 71     Resp 02/15/23 1749 18     Temp 02/15/23 1749 98.2 F (36.8 C)     Temp Source 02/15/23 1749 Oral     SpO2 02/15/23 1749 95 %     Weight 02/15/23 1750 289 lb 14.5 oz (131.5 kg)     Height --      Head Circumference --      Peak Flow --      Pain Score 02/15/23 1750 10     Pain Loc --      Pain Education --      Exclude from Growth Chart --     Most recent vital signs: Vitals:   02/15/23 1749 02/15/23 1952  BP: (!) 136/105 (!) 141/81  Pulse: 71 90  Resp: 18 20  Temp: 98.2 F (36.8 C) 98.2 F (36.8 C)  SpO2: 95% 99%    Physical Exam Constitutional:      Appearance: She is well-developed.  HENT:     Head: Atraumatic.  Eyes:     Conjunctiva/sclera: Conjunctivae normal.  Cardiovascular:     Rate and Rhythm: Regular rhythm.  Pulmonary:     Effort:  No respiratory distress.  Abdominal:     General: There is no distension.  Musculoskeletal:        General: Normal range of motion.     Cervical back: Normal range of motion.  Skin:    General: Skin is warm.  Neurological:     Mental Status: She is alert. Mental status is at baseline.     GCS: GCS eye subscore is 4. GCS verbal subscore is 5. GCS motor subscore is 6.     Sensory: Sensation is intact.     Motor: Motor function is intact.     Coordination: Coordination is intact.     Comments: No nystagmus      IMPRESSION / MDM / ASSESSMENT AND PLAN / ED COURSE  I reviewed the triage vital signs and the nursing notes.  Differential diagnosis including subarachnoid hemorrhage, migraine headache, tension headache.  Have a low suspicion for meningitis, no meningismus on exam.  Included in the differential cerebral venous thrombosis, RCVS.  Plan for CTA and migraine medication and will reevaluate.   RADIOLOGY I independently reviewed imaging, my interpretation of imaging: CTA head -no signs of intracranial hemorrhage.  CTA was read as no acute findings.   Labs (all labs ordered are listed, but only abnormal results are displayed) Labs interpreted as -    Labs Reviewed - No data to display  Adamantly denies any concern for pregnancy.   Clinical Course as of 02/15/23 2133  Wed Feb 15, 2023  2042 On reevaluation patient states that her headache has resolved and no longer having any headache symptoms at all. [SM]    Clinical Course User Index [SM] Corena Herter, MD   Symptom-free.  Feeling much better.  Repeat blood pressure improved to 140/81.  Have a low suspicion for cerebral venous thrombosis or RCVS.  Clinical picture is not consistent with a meningitis.  Given information and a referral to primary care provider.  Given return precautions for any return of symptoms.  No questions at time of discharge.  PROCEDURES:  Critical Care performed: No  Procedures  Patient's  presentation is most consistent with acute presentation with potential threat to life or bodily function.   MEDICATIONS ORDERED IN ED: Medications  sodium chloride 0.9 % bolus 1,000 mL (1,000 mLs Intravenous New Bag/Given 02/15/23 1940)  ketorolac (TORADOL) 15 MG/ML injection 15 mg (15 mg Intravenous Given 02/15/23 1946)  prochlorperazine (COMPAZINE) injection 10 mg (10 mg Intravenous Given 02/15/23 1940)  iohexol (OMNIPAQUE) 350 MG/ML injection 75 mL (75 mLs Intravenous Contrast Given 02/15/23 1959)    FINAL CLINICAL IMPRESSION(S) / ED DIAGNOSES   Final diagnoses:  Other migraine without status migrainosus, not intractable  Hypertension, unspecified type     Rx / DC Orders   ED Discharge Orders          Ordered    Ambulatory Referral to Primary Care (Establish Care)        02/15/23 2128             Note:  This document was prepared using Dragon voice recognition software and may include unintentional dictation errors.   Corena Herter, MD 02/15/23 2134

## 2023-10-10 NOTE — Progress Notes (Deleted)
 GYNECOLOGY ANNUAL PHYSICAL EXAM NOTE  Subjective:    Beth Carrillo is a 37 y.o. G37P1011 female who presents for an annual exam.  The patient {is/is not/has never been:13135} sexually active. The patient participates in regular exercise: {yes/no/not asked:9010}. Has the patient ever been transfused or tattooed?: {yes/no/not asked:9010}. The patient reports that there {is/is not:9024} domestic violence in her life.   The patient has the following complaints today: ***  Menstrual History: Menarche age: *** No LMP recorded.     Gynecologic History:  Contraception: {method:5051} History of STI's:  Last Pap: 08/18/22. Results were: normal.  ***Denies/Notes h/o abnormal pap smears. Last mammogram: 07/16/15. Results were: normal       OB History  Gravida Para Term Preterm AB Living  2 1 1  0 1 1  SAB IAB Ectopic Multiple Live Births  1 0 0 0 1    # Outcome Date GA Lbr Len/2nd Weight Sex Type Anes PTL Lv  2 SAB           1 Term             Past Medical History:  Diagnosis Date   Obesity     Past Surgical History:  Procedure Laterality Date   CESAREAN SECTION      Family History  Problem Relation Age of Onset   Breast cancer Maternal Grandmother        not sure age   Diabetes Maternal Grandmother    Diabetes Paternal Grandfather     Social History   Socioeconomic History   Marital status: Single    Spouse name: Not on file   Number of children: Not on file   Years of education: Not on file   Highest education level: Not on file  Occupational History   Not on file  Tobacco Use   Smoking status: Never   Smokeless tobacco: Never  Vaping Use   Vaping status: Never Used  Substance and Sexual Activity   Alcohol use: Yes    Comment: occasional   Drug use: No   Sexual activity: Yes    Birth control/protection: Condom, None  Other Topics Concern   Not on file  Social History Narrative   Not on file   Social Drivers of Health   Financial Resource  Strain: Not on file  Food Insecurity: Not on file  Transportation Needs: Not on file  Physical Activity: Not on file  Stress: Not on file  Social Connections: Not on file  Intimate Partner Violence: Not on file    No current outpatient medications on file prior to visit.   No current facility-administered medications on file prior to visit.    Allergies  Allergen Reactions   Amoxicillin Hives   Morphine And Codeine Nausea And Vomiting   Penicillins Hives    Has patient had a PCN reaction causing immediate rash, facial/tongue/throat swelling, SOB or lightheadedness with hypotension: Yes Has patient had a PCN reaction causing severe rash involving mucus membranes or skin necrosis: No Has patient had a PCN reaction that required hospitalization No Has patient had a PCN reaction occurring within the last 10 years: Yes If all of the above answers are "NO", then may proceed with Cephalosporin use.    Pineapple Itching    Irritates the mouth    Tomato Itching    Irritates the mouth     Review of Systems Constitutional: negative for chills, fatigue, fevers and sweats Eyes: negative for irritation, redness and visual disturbance Ears,  nose, mouth, throat, and face: negative for hearing loss, nasal congestion, snoring and tinnitus Respiratory: negative for asthma, cough, sputum Cardiovascular: negative for chest pain, dyspnea, exertional chest pressure/discomfort, irregular heart beat, palpitations and syncope Gastrointestinal: negative for abdominal pain, change in bowel habits, nausea and vomiting Genitourinary: negative for abnormal menstrual periods, genital lesions, sexual problems and vaginal discharge, dysuria and urinary incontinence Integument/breast: negative for breast lump, breast tenderness and nipple discharge Hematologic/lymphatic: negative for bleeding and easy bruising Musculoskeletal:negative for back pain and muscle weakness Neurological: negative for dizziness,  headaches, vertigo and weakness Endocrine: negative for diabetic symptoms including polydipsia, polyuria and skin dryness Allergic/Immunologic: negative for hay fever and urticaria      Objective:  There were no vitals taken for this visit. There is no height or weight on file to calculate BMI.    General Appearance:    Alert, cooperative, no distress, appears stated age  Head:    Normocephalic, without obvious abnormality, atraumatic  Eyes:    PERRL, conjunctiva/corneas clear, EOMs intact bilaterally  Ears:    Normal external ear canals bilaterally  Nose:   Nares normal  Throat:   Lips, mucosa, and tongue normal; teeth and gums normal  Neck:   Supple, symmetrical, trachea midline, no adenopathy; thyroid: no enlargement/tenderness/nodules; no carotid bruit or JVD  Back:     ROM normal, no CVA tenderness  Lungs:     Clear to auscultation bilaterally, respirations unlabored  Chest Wall:    No tenderness or deformity   Heart:    Regular rate and rhythm, S1 and S2 normal, no appreciated murmur, rub or gallop  Breast Exam:    No tenderness, masses, or nipple abnormality; no skin retraction, dimpling or nipple discharge.  Abdomen:     Soft, non-tender, bowel sounds active all four quadrants, no masses, no organomegaly.    Genitalia:    Pelvic:external genitalia normal, vagina without lesions, discharge, or tenderness, rectovaginal septum  normal. Cervix normal in appearance, no cervical motion tenderness, no adnexal masses or tenderness.  Uterus normal size, shape, mobile, regular contours, nontender.  Rectal:    Normal external sphincter.  No hemorrhoids appreciated. Internal exam not done.   Extremities:   Extremities normal, atraumatic, no cyanosis or edema  Pulses:   2+ and symmetric all extremities  Skin:   Skin color, texture, turgor normal, no rashes or lesions  Lymph nodes:   Cervical, supraclavicular, and axillary nodes normal  Neurologic:   CNII-XII intact, normal strength, sensation  and reflexes throughout   .  Labs:  Lab Results  Component Value Date   WBC 8.6 10/25/2019   HGB 11.6 (L) 10/25/2019   HCT 37.1 10/25/2019   MCV 88.8 10/25/2019   PLT 313 10/25/2019    Lab Results  Component Value Date   CREATININE 0.92 10/25/2019   BUN 14 10/25/2019   NA 139 10/25/2019   K 3.8 10/25/2019   CL 104 10/25/2019   CO2 28 10/25/2019    Lab Results  Component Value Date   ALT 12 11/16/2017   AST 13 (L) 11/16/2017   ALKPHOS 55 11/16/2017   BILITOT 0.3 11/16/2017    No results found for: "TSH"   Assessment:   No diagnosis found.   Plan:   Beth Carrillo is a 38 y.o. G22P1011 female here today for her annual exam, doing well.  -Screenings:  Pap: done with cotesting today  *** w/rflx today Mammogram: ordered***due *** Labs: ***A1C, CMP, HepC, Lipid panel, Vit D, TSH PHQ-2 =  0,    = ___, discussed coping techniques; RTC if worsens or develops concern -Contraception: *** -Vaccines: UTD, Tdap today; pt declines Influenza. Gardasil: series not started, declined today.  -Healthy lifestyle modifications discussed: multivitamin, diet, exercise, sunscreen. Emphasized importance of regular physical activity.  -Folic Acid recommendation reviewed.  -All questions answered to patient's satisfaction.  -RTC 1 yr for annual, sooner prn.    Rian Chafe, CMA [facility]

## 2023-10-11 ENCOUNTER — Encounter: Payer: Self-pay | Admitting: Obstetrics

## 2023-10-17 ENCOUNTER — Encounter: Payer: Self-pay | Admitting: Obstetrics
